# Patient Record
Sex: Female | Born: 1990 | Race: Black or African American | Hispanic: No | Marital: Married | Smoking: Never smoker
Health system: Southern US, Community
[De-identification: ages and names within clinical notes are randomized; demographics above are authoritative.]

## PROBLEM LIST (undated history)

## (undated) DIAGNOSIS — Z789 Other specified health status: Secondary | ICD-10-CM

---

## 2013-12-31 ENCOUNTER — Encounter (HOSPITAL_COMMUNITY): Payer: Self-pay | Admitting: Emergency Medicine

## 2013-12-31 ENCOUNTER — Emergency Department (HOSPITAL_COMMUNITY)
Admission: EM | Admit: 2013-12-31 | Discharge: 2014-01-01 | Disposition: A | Discharge status: Home / Self Care | Payer: Self-pay | Attending: Emergency Medicine | Admitting: Emergency Medicine

## 2013-12-31 DIAGNOSIS — J069 Acute upper respiratory infection, unspecified: Secondary | ICD-10-CM | POA: Insufficient documentation

## 2013-12-31 DIAGNOSIS — J040 Acute laryngitis: Secondary | ICD-10-CM | POA: Insufficient documentation

## 2013-12-31 DIAGNOSIS — Z79899 Other long term (current) drug therapy: Secondary | ICD-10-CM | POA: Insufficient documentation

## 2013-12-31 DIAGNOSIS — B9789 Other viral agents as the cause of diseases classified elsewhere: Secondary | ICD-10-CM

## 2013-12-31 NOTE — ED Notes (Signed)
Pt c/o sore throat with voice changes, pt states she did have n/v earlier today. Pt states feels similar to strep throat.

## 2014-01-01 LAB — RAPID STREP SCREEN (MED CTR MEBANE ONLY): Streptococcus, Group A Screen (Direct): NEGATIVE

## 2014-01-01 MED ORDER — GUAIFENESIN ER 1200 MG PO TB12: 1.0000 | ORAL_TABLET | Freq: Two times a day (BID) | ORAL | Status: DC

## 2014-01-01 MED ORDER — ACETAMINOPHEN-CODEINE 120-12 MG/5ML PO SOLN: 10.0000 mL | ORAL | Status: DC | PRN

## 2014-01-01 MED ORDER — PREDNISONE 50 MG PO TABS: 50.0000 mg | ORAL_TABLET | Freq: Every day | ORAL | Status: DC

## 2014-01-01 NOTE — Discharge Instructions (Signed)
Increase your fluid intake and rest as much possible.

## 2014-01-01 NOTE — ED Provider Notes (Signed)
CSN: [id number]     Arrival date & time 12/31/13  2210 History   First MD Initiated Contact with Patient 12/31/13 2336     Chief Complaint  Patient presents with  . Sore Throat     (Consider location/radiation/quality/duration/timing/severity/associated sxs/prior Treatment) HPI Patient presents to the emergency department with sore throat and cough over the last 4 days.  The patient states that she has lost her voice.  The patient states she did not take any medications prior to arrival.  The patient states that she did not have any chest pain, shortness of breath, nausea, vomiting, weakness, dizziness, rash, lightheadedness, dysuria, fever, runny nose, diarrhea, abdominal pain, headache, blurred vision, decreased appetite, rash, or syncope.  The patient states that nothing seems make her condition better or worse.  Patient states that she does not smoke or drink History reviewed. No pertinent past medical history. Past Surgical History  Procedure Laterality Date  . Cesarean section     No family history on file. History  Substance Use Topics  . Smoking status: Never Smoker   . Smokeless tobacco: Not on file  . Alcohol Use: No   OB History    No data available     Review of Systems  All other systems negative except as documented in the HPI. All pertinent positives and negatives as reviewed in the HPI.   Allergies  Sulfa antibiotics  Home Medications   Prior to Admission medications   Medication Sig Start Date End Date Taking? Authorizing Provider  guaiFENesin (MUCINEX) 600 MG 12 hr tablet Take 600 mg by mouth 3 (three) times daily.   Yes Historical Provider, MD   BP 111/77 mmHg  Pulse 91  Temp(Src) 98.4 F (36.9 C) (Oral)  Resp 18  Ht 5' (1.524 m)  Wt 175 lb (79.379 kg)  BMI 34.18 kg/m2  SpO2 100%  LMP 03/31/2013 Physical Exam  Constitutional: She is oriented to person, place, and time. She appears well-developed and well-nourished. No distress.  HENT:  Head:  Normocephalic and atraumatic.  Mouth/Throat: Oropharynx is clear and moist.  Eyes: Pupils are equal, round, and reactive to light.  Neck: Normal range of motion. Neck supple.  Cardiovascular: Normal rate, regular rhythm and normal heart sounds.  Exam reveals no gallop and no friction rub.   No murmur heard. Pulmonary/Chest: Effort normal and breath sounds normal. No respiratory distress.  Musculoskeletal: She exhibits no edema.  Neurological: She is alert and oriented to person, place, and time. She exhibits normal muscle tone. Coordination normal.  Skin: Skin is warm and dry.  Nursing note and vitals reviewed.   ED Course  Procedures (including critical care time) Labs Review Labs Reviewed  RAPID STREP SCREEN  CULTURE, GROUP A STREP   Patient is advised of her test results and told to return here as needed.  Advised her to increase her fluid intake and rest as much as possible.  MDM   Final diagnoses:  Laryngitis  Viral URI with cough        Carlyle DollyChristopher W Saba Neuman, PA-C 01/01/14 1651  Loren Raceravid Yelverton, MD 01/02/14 [id number]

## 2014-01-03 LAB — CULTURE, GROUP A STREP

## 2014-01-19 ENCOUNTER — Emergency Department (HOSPITAL_COMMUNITY)
Admission: EM | Admit: 2014-01-19 | Discharge: 2014-01-19 | Disposition: A | Discharge status: Home / Self Care | Payer: Self-pay | Attending: Emergency Medicine | Admitting: Emergency Medicine

## 2014-01-19 ENCOUNTER — Encounter (HOSPITAL_COMMUNITY): Payer: Self-pay | Admitting: *Deleted

## 2014-01-19 DIAGNOSIS — M542 Cervicalgia: Secondary | ICD-10-CM | POA: Insufficient documentation

## 2014-01-19 DIAGNOSIS — J029 Acute pharyngitis, unspecified: Secondary | ICD-10-CM | POA: Insufficient documentation

## 2014-01-19 DIAGNOSIS — Z79891 Long term (current) use of opiate analgesic: Secondary | ICD-10-CM | POA: Insufficient documentation

## 2014-01-19 MED ORDER — GUAIFENESIN ER 1200 MG PO TB12: 1.0000 | ORAL_TABLET | Freq: Two times a day (BID) | ORAL | Status: DC

## 2014-01-19 MED ORDER — PREDNISONE 50 MG PO TABS: 50.0000 mg | ORAL_TABLET | Freq: Every day | ORAL | Status: DC

## 2014-01-19 NOTE — ED Notes (Signed)
Pt states she has had sore throat and cough since yesterday. Pt states she was seen two weeks ago for same, was given prednisone, which helped.

## 2014-01-19 NOTE — Discharge Instructions (Signed)

## 2014-01-19 NOTE — ED Provider Notes (Signed)
CSN: [id number]     Arrival date & time 01/19/14  [id number] History  This chart was scribed for non-physician practitioner, Fayrene HelperBowie Chawn Spraggins, PA-C,working with Suzi RootsKevin E Steinl, MD, by Karle PlumberJennifer Tensley, ED Scribe. This patient was seen in room WTR8/WTR8 and the patient's care was started at 7:13 PM.  Chief Complaint  Patient presents with  . Sore Throat  . Cough   HPI  HPI Comments:  Alexis HammersmithBrittany Byrd is a 23 y.o. female who presents to the Emergency Department complaining of severe sore throat that began yesterday. Pt reports she has associated cough, neck pain and some loss of voice. She reports she has similar symptoms two weeks ago but they resolved with Prednisone and Guaifenesin.  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 Reports she has been doing salt water gargles. Denies any modifying factors. Denies fever, chills, otalgia, nausea, vomiting, SOB, abdominal pain or diarrhea.   History reviewed. No pertinent past medical history. Past Surgical History  Procedure Laterality Date  . Cesarean section     No family history on file. History  Substance Use Topics  . Smoking status: Never Smoker   . Smokeless tobacco: Not on file  . Alcohol Use: No   OB History    No data available     Review of Systems  Constitutional: Negative for fever and chills.  HENT: Positive for sore throat. Negative for ear pain.   Respiratory: Positive for cough.   Gastrointestinal: Negative for nausea, vomiting, abdominal pain and diarrhea.  All other systems reviewed and are negative.   Allergies  Sulfa antibiotics  Home Medications   Prior to Admission medications   Medication Sig Start Date End Date Taking? Authorizing Provider  acetaminophen-codeine 120-12 MG/5ML solution Take 10 mLs by mouth every 4 (four) hours as needed for moderate pain. 01/01/14   Jamesetta Orleanshristopher W Lawyer, PA-C  Guaifenesin 1200 MG TB12 Take 1 tablet (1,200 mg total) by mouth 2 (two) times daily. 01/01/14    Jamesetta Orleanshristopher W Lawyer, PA-C  predniSONE (DELTASONE) 50 MG tablet Take 1 tablet (50 mg total) by mouth daily. 01/01/14   Carlyle Dollyhristopher W Lawyer, PA-C   Triage Vitals: BP 144/84 mmHg  Pulse 74  Temp(Src) 99 F (37.2 C) (Oral)  Resp 18  SpO2 98%  LMP 03/31/2013 Physical Exam  Constitutional: She is oriented to person, place, and time. She appears well-developed and well-nourished.  HENT:  Head: Normocephalic and atraumatic.  Right Ear: Tympanic membrane normal.  Left Ear: Tympanic membrane normal.  Nose: Nose normal.  Mouth/Throat: Uvula is midline and mucous membranes are normal. No trismus in the jaw. No oropharyngeal exudate or tonsillar abscesses.  Mild tonsillar enlargement bilaterally.  Eyes: EOM are normal.  Neck: Normal range of motion.  Cardiovascular: Normal rate, regular rhythm and normal heart sounds.  Exam reveals no gallop and no friction rub.   No murmur heard. Pulmonary/Chest: Effort normal. No respiratory distress. She has no wheezes. She has no rales.  Musculoskeletal: Normal range of motion.  Neurological: She is alert and oriented to person, place, and time.  Skin: Skin is warm and dry.  Psychiatric: She has a normal mood and affect. Her behavior is normal.  Nursing note and vitals reviewed.   ED Course  Procedures (including critical care time) DIAGNOSTIC STUDIES: Oxygen Saturation is 98% on RA, normal by my interpretation.   COORDINATION OF CARE: 7:17 PM- Will prescribe Prednisone and Guaifenesin and give referral to ENT. Pt verbalizes understanding and agrees to plan.  Medications - No data to display  Labs Review Labs Reviewed - No data to display  Imaging Review No results found.   EKG Interpretation None      MDM   Final diagnoses:  Pharyngitis    BP 144/84 mmHg  Pulse 74  Temp(Src) 99 F (37.2 C) (Oral)  Resp 18  SpO2 98%  LMP 03/31/2013   I personally performed the services described in this documentation, which was scribed in  my presence. The recorded information has been reviewed and is accurate.    Fayrene HelperBowie Culver Feighner, PA-C 01/19/14 2002  Suzi RootsKevin E Steinl, MD 01/19/14 2003

## 2014-02-18 ENCOUNTER — Encounter (HOSPITAL_COMMUNITY): Payer: Self-pay | Admitting: Emergency Medicine

## 2014-02-18 ENCOUNTER — Emergency Department (HOSPITAL_COMMUNITY)
Admission: EM | Admit: 2014-02-18 | Discharge: 2014-02-18 | Disposition: A | Discharge status: Home / Self Care | Payer: Medicaid Other | Attending: Emergency Medicine | Admitting: Emergency Medicine

## 2014-02-18 DIAGNOSIS — B9689 Other specified bacterial agents as the cause of diseases classified elsewhere: Secondary | ICD-10-CM

## 2014-02-18 DIAGNOSIS — M545 Low back pain, unspecified: Secondary | ICD-10-CM

## 2014-02-18 DIAGNOSIS — N760 Acute vaginitis: Secondary | ICD-10-CM

## 2014-02-18 DIAGNOSIS — R1030 Lower abdominal pain, unspecified: Secondary | ICD-10-CM | POA: Diagnosis present

## 2014-02-18 DIAGNOSIS — Z7952 Long term (current) use of systemic steroids: Secondary | ICD-10-CM | POA: Diagnosis not present

## 2014-02-18 DIAGNOSIS — Z79899 Other long term (current) drug therapy: Secondary | ICD-10-CM | POA: Insufficient documentation

## 2014-02-18 DIAGNOSIS — R102 Pelvic and perineal pain: Secondary | ICD-10-CM

## 2014-02-18 DIAGNOSIS — Z9889 Other specified postprocedural states: Secondary | ICD-10-CM | POA: Insufficient documentation

## 2014-02-18 LAB — URINALYSIS, ROUTINE W REFLEX MICROSCOPIC
BILIRUBIN URINE: NEGATIVE
Glucose, UA: NEGATIVE mg/dL
Hgb urine dipstick: NEGATIVE
Ketones, ur: NEGATIVE mg/dL
Leukocytes, UA: NEGATIVE
NITRITE: NEGATIVE
PROTEIN: NEGATIVE mg/dL
Specific Gravity, Urine: 1.004 — ABNORMAL LOW (ref 1.005–1.030)
UROBILINOGEN UA: 0.2 mg/dL (ref 0.0–1.0)
pH: 6.5 (ref 5.0–8.0)

## 2014-02-18 LAB — POC URINE PREG, ED: PREG TEST UR: NEGATIVE

## 2014-02-18 LAB — CBC WITH DIFFERENTIAL/PLATELET
BASOS ABS: 0 10*3/uL (ref 0.0–0.1)
BASOS PCT: 0 % (ref 0–1)
Eosinophils Absolute: 0.1 10*3/uL (ref 0.0–0.7)
Eosinophils Relative: 2 % (ref 0–5)
HCT: 40.7 % (ref 36.0–46.0)
HEMOGLOBIN: 12.7 g/dL (ref 12.0–15.0)
LYMPHS PCT: 39 % (ref 12–46)
Lymphs Abs: 1.9 10*3/uL (ref 0.7–4.0)
MCH: 26.8 pg (ref 26.0–34.0)
MCHC: 31.2 g/dL (ref 30.0–36.0)
MCV: 85.9 fL (ref 78.0–100.0)
MONO ABS: 0.7 10*3/uL (ref 0.1–1.0)
MONOS PCT: 15 % — AB (ref 3–12)
Neutro Abs: 2.2 10*3/uL (ref 1.7–7.7)
Neutrophils Relative %: 44 % (ref 43–77)
PLATELETS: 214 10*3/uL (ref 150–400)
RBC: 4.74 MIL/uL (ref 3.87–5.11)
RDW: 14.2 % (ref 11.5–15.5)
WBC: 5 10*3/uL (ref 4.0–10.5)

## 2014-02-18 LAB — COMPREHENSIVE METABOLIC PANEL
ALT: 32 U/L (ref 0–35)
AST: 25 U/L (ref 0–37)
Albumin: 4 g/dL (ref 3.5–5.2)
Alkaline Phosphatase: 64 U/L (ref 39–117)
Anion gap: 9 (ref 5–15)
BUN: 14 mg/dL (ref 6–23)
CO2: 24 mmol/L (ref 19–32)
Calcium: 9.2 mg/dL (ref 8.4–10.5)
Chloride: 104 mmol/L (ref 96–112)
Creatinine, Ser: 0.66 mg/dL (ref 0.50–1.10)
GFR calc Af Amer: >90 mL/min (ref >90)
Glucose, Bld: 83 mg/dL (ref 70–99)
Potassium: 4.3 mmol/L (ref 3.5–5.1)
Sodium: 137 mmol/L (ref 135–145)
TOTAL PROTEIN: 8 g/dL (ref 6.0–8.3)
Total Bilirubin: 0.6 mg/dL (ref 0.3–1.2)

## 2014-02-18 LAB — WET PREP, GENITAL
Trich, Wet Prep: NONE SEEN
YEAST WET PREP: NONE SEEN

## 2014-02-18 MED ORDER — METRONIDAZOLE 500 MG PO TABS: 500.0000 mg | ORAL_TABLET | Freq: Two times a day (BID) | ORAL | Status: DC

## 2014-02-18 MED ORDER — KETOROLAC TROMETHAMINE 60 MG/2ML IM SOLN
60.0000 mg | Freq: Once | INTRAMUSCULAR | Status: AC
  Administered 2014-02-18: 60 mg via INTRAMUSCULAR
  Filled 2014-02-18: qty 2

## 2014-02-18 NOTE — ED Provider Notes (Signed)
CSN: [id number]     Arrival date & time 02/18/14  1107 History   First MD Initiated Contact with Patient 02/18/14 1135     Chief Complaint  Patient presents with  . Groin Pain  . Back Pain  . Dysuria     (Consider location/radiation/quality/duration/timing/severity/associated sxs/prior Treatment) HPI  Alexis Byrd is a 24 y.o. female with PMH of cesarean section presenting with complaint of bilateral low back pain as well as right groin pain that she has had intermittently for one month. Patient reports it feels like "my ovary is swollen" as well as sharp. Patient does not note aggravating or alleviating factors. She is not taken anything for her discomfort. She does endorse colicky as well as strong smell to her urine. She denies pain or burning with urination. She has used Azo for urinary symptoms without significant relief. She denies any fevers, chills, nausea, vomiting, stool changes, chest pain, shortness of breath. Patient has had history of abdominal surgeries as well as chlamydia. She uses Nexplanon but does not use condoms. She denies new sexual partners. She does endorse copious white discharge but denies foul odor. No loss of control of bladder or bowel. No saddle anesthesia or numbness and tingling or weakness in her lower extremities.   History reviewed. No pertinent past medical history. Past Surgical History  Procedure Laterality Date  . Cesarean section     History reviewed. No pertinent family history. History  Substance Use Topics  . Smoking status: Never Smoker   . Smokeless tobacco: Not on file  . Alcohol Use: No   OB History    No data available     Review of Systems 10 Systems reviewed and are negative for acute change except as noted in the HPI.    Allergies  Sulfa antibiotics  Home Medications   Prior to Admission medications   Medication Sig Start Date End Date Taking? Authorizing Provider  etonogestrel (NEXPLANON) 68 MG IMPL implant 1 each by  Subdermal route once. March 2015   Yes Historical Provider, MD  acetaminophen-codeine 120-12 MG/5ML solution Take 10 mLs by mouth every 4 (four) hours as needed for moderate pain. Patient not taking: Reported on 02/18/2014 01/01/14   Jamesetta Orleanshristopher W Lawyer, PA-C  Guaifenesin 1200 MG TB12 Take 1 tablet (1,200 mg total) by mouth 2 (two) times daily. Patient not taking: Reported on 02/18/2014 01/19/14   Fayrene HelperBowie Tran, PA-C  metroNIDAZOLE (FLAGYL) 500 MG tablet Take 1 tablet (500 mg total) by mouth 2 (two) times daily. 02/18/14   Louann SjogrenVictoria L Kiley Torrence, PA-C  predniSONE (DELTASONE) 50 MG tablet Take 1 tablet (50 mg total) by mouth daily. Patient not taking: Reported on 02/18/2014 01/19/14   Fayrene HelperBowie Tran, PA-C   BP 94/49 mmHg  Pulse 78  Temp(Src) 99.2 F (37.3 C) (Oral)  Resp 20  SpO2 98% Physical Exam  Constitutional: She appears well-developed and well-nourished. No distress.  HENT:  Head: Normocephalic and atraumatic.  Mouth/Throat: Oropharynx is clear and moist.  Eyes: Conjunctivae and EOM are normal. Right eye exhibits no discharge. Left eye exhibits no discharge.  Cardiovascular: Normal rate, regular rhythm and normal heart sounds.   Pulmonary/Chest: Effort normal and breath sounds normal. No respiratory distress. She has no wheezes.  Abdominal: Soft. Bowel sounds are normal. She exhibits no distension. There is no tenderness.  Right lower pelvic pain without rebound, rigidity, guarding. No CVA tenderness.  Genitourinary:  Cervix pink without lesions. Os closed. No CMT no left with right adnexal tenderness. Moderate white milky  discharge without foul odor. Nursing tech in room for exam.  Musculoskeletal:  No midline back tenderness, step off or crepitus. Bilateral lower back tenderness. No CVA tenderness.   Neurological: She is alert. She exhibits normal muscle tone. Coordination normal.  Equal muscle tone. 5/5 strength in lower extremities. DTR equal and intact. Negative straight leg test. Normal  gait.   Skin: Skin is warm and dry. She is not diaphoretic.  Nursing note and vitals reviewed.   ED Course  Procedures (including critical care time) Labs Review Labs Reviewed  WET PREP, GENITAL - Abnormal; Notable for the following:    Clue Cells Wet Prep HPF POC FEW (*)    WBC, Wet Prep HPF POC FEW (*)    All other components within normal limits  CBC WITH DIFFERENTIAL/PLATELET - Abnormal; Notable for the following:    Monocytes Relative 15 (*)    All other components within normal limits  URINALYSIS, ROUTINE W REFLEX MICROSCOPIC - Abnormal; Notable for the following:    APPearance CLOUDY (*)    Specific Gravity, Urine 1.004 (*)    All other components within normal limits  URINE CULTURE  COMPREHENSIVE METABOLIC PANEL  RPR  HIV ANTIBODY (ROUTINE TESTING)  POC URINE PREG, ED  GC/CHLAMYDIA PROBE AMP ([place])    Imaging Review No results found.   EKG Interpretation None      MDM   Final diagnoses:  Pelvic pain in female  Bilateral low back pain without sciatica  BV (bacterial vaginosis)   Patient with history of right lower pelvic discomfort over the past one month that is intermittent. Patient denies any other abdominal symptoms. No fevers or chills. She states this feels like a UTI however her urine is clear. She has noted an increase in white foul smelling vaginal discharge. VSS. Patient with mild right lower abdominal tenderness. Pelvic exam with much more discomfort than abdominal exam. I doubt appendicitis or torsion. Wet prep with few white blood cells as well as clue cells. No CMT. I doubt PID. Patient diagnosed with BV.  Discussed that patients STD testing is pending and she is to inform all partners if they become positive and presented for treatment. On repeat abdominal exam patient's pain with significant improvement. No evidence of peritonitis. Lab work noncontributory. Patient to follow-up with [facility] for further workup.  Discussed return  precautions with patient. Discussed all results and patient verbalizes understanding and agrees with plan.  This is a shared patient. This patient was discussed with the physician who saw and evaluated the patient and agrees with the plan.     Louann Sjogren, PA-C 02/18/14 1628  Donnetta Hutching, MD 02/19/14 [phone]

## 2014-02-18 NOTE — ED Notes (Signed)
Pt c/o lower back pain and right groin pain. Reports she feels like "my ovary is swollen." Also c/o dysuria, malodorous urine and "darker urine than usual." Denies fever/chills/nausea/vomiting. RR even/unlabored. Attempted using Azo for urinary symptoms.

## 2014-02-18 NOTE — Discharge Instructions (Signed)
Return to the emergency room with worsening of symptoms, new symptoms or with symptoms that are concerning , especially fevers, abdominal pain in one area, unable to keep down fluids, blood in stool or vomit, severe pain, you feel faint, lightheaded or pass out or fevers, loss of control of bladder or bowels, numbness or tingling around genital region or anus, weakness. RICE: Rest, Ice (three cycles of 20 mins on, off at least twice a day), compression/brace, elevation. Heating pad works well for back pain. Ibuprofen  (2 tablets ) every 5-6 hours for 3-5 days  Follow up with PCP/orthopedist if symptoms worsen or are persistent. Call to make appointment with [facility] for follow-up and possible ultrasound.   Abdominal Pain, Women Abdominal (stomach, pelvic, or belly) pain can be caused by many things. It is important to tell your doctor:  The location of the pain.  Does it come and go or is it present all the time?  Are there things that start the pain (eating certain foods, exercise)?  Are there other symptoms associated with the pain (fever, nausea, vomiting, diarrhea)? All of this is helpful to know when trying to find the cause of the pain. CAUSES   Stomach: virus or bacteria infection, or ulcer.  Intestine: appendicitis (inflamed appendix), regional ileitis (Crohn's disease), ulcerative colitis (inflamed colon), irritable bowel syndrome, diverticulitis (inflamed diverticulum of the colon), or cancer of the stomach or intestine.  Gallbladder disease or stones in the gallbladder.  Kidney disease, kidney stones, or infection.  Pancreas infection or cancer.  Fibromyalgia (pain disorder).  Diseases of the female organs:  Uterus: fibroid (non-cancerous) tumors or infection.  Fallopian tubes: infection or tubal pregnancy.  Ovary: cysts or tumors.  Pelvic adhesions (scar tissue).  Endometriosis (uterus lining tissue growing in the pelvis and on the pelvic  organs).  Pelvic congestion syndrome (female organs filling up with blood just before the menstrual period).  Pain with the menstrual period.  Pain with ovulation (producing an egg).  Pain with an IUD (intrauterine device, birth control) in the uterus.  Cancer of the female organs.  Functional pain (pain not caused by a disease, may improve without treatment).  Psychological pain.  Depression. DIAGNOSIS  Your doctor will decide the seriousness of your pain by doing an examination.  Blood tests.  X-rays.  Ultrasound.  CT scan (computed tomography, special type of X-ray).  MRI (magnetic resonance imaging).  Cultures, for infection.  Barium enema (dye inserted in the large intestine, to better view it with X-rays).  Colonoscopy (looking in intestine with a lighted tube).  Laparoscopy (minor surgery, looking in abdomen with a lighted tube).  Major abdominal exploratory surgery (looking in abdomen with a large incision). TREATMENT  The treatment will depend on the cause of the pain.   Many cases can be observed and treated at home.  Over-the-counter medicines recommended by your caregiver.  Prescription medicine.  Antibiotics, for infection.  Birth control pills, for painful periods or for ovulation pain.  Hormone treatment, for endometriosis.  Nerve blocking injections.  Physical therapy.  Antidepressants.  Counseling with a psychologist or psychiatrist.  Minor or major surgery. HOME CARE INSTRUCTIONS   Do not take laxatives, unless directed by your caregiver.  Take over-the-counter pain medicine only if ordered by your caregiver. Do not take aspirin because it can cause an upset stomach or bleeding.  Try a clear liquid diet (broth or water) as ordered by your caregiver. Slowly move to a bland diet, as tolerated, if the  pain is related to the stomach or intestine.  Have a thermometer and take your temperature several times a day, and record  it.  Bed rest and sleep, if it helps the pain.  Avoid sexual intercourse, if it causes pain.  Avoid stressful situations.  Keep your follow-up appointments and tests, as your caregiver orders.  If the pain does not go away with medicine or surgery, you may try:  Acupuncture.  Relaxation exercises (yoga, meditation).  Group therapy.  Counseling. SEEK MEDICAL CARE IF:   You notice certain foods cause stomach pain.  Your home care treatment is not helping your pain.  You need stronger pain medicine.  You want your IUD removed.  You feel faint or lightheaded.  You develop nausea and vomiting.  You develop a rash.  You are having side effects or an allergy to your medicine. SEEK IMMEDIATE MEDICAL CARE IF:   Your pain does not go away or gets worse.  You have a fever.  Your pain is felt only in portions of the abdomen. The right side could possibly be appendicitis. The left lower portion of the abdomen could be colitis or diverticulitis.  You are passing blood in your stools (bright red or black tarry stools, with or without vomiting).  You have blood in your urine.  You develop chills, with or without a fever.  You pass out. MAKE SURE YOU:   Understand these instructions.  Will watch your condition.  Will get help right away if you are not doing well or get worse. Document Released: 11/03/2006 Document Revised: 05/23/2013 Document Reviewed: 11/23/2008 [facility] Patient Information 2015 [place], [place]. This information is not intended to replace advice given to you by your health care provider. Make sure you discuss any questions you have with your health care provider.

## 2014-02-19 LAB — URINE CULTURE: Colony Count: 30000

## 2014-02-20 LAB — GC/CHLAMYDIA PROBE AMP (~~LOC~~) NOT AT ARMC
Chlamydia: NEGATIVE
Neisseria Gonorrhea: NEGATIVE

## 2014-02-21 LAB — RPR: RPR Ser Ql: NONREACTIVE

## 2014-02-21 LAB — HIV ANTIBODY (ROUTINE TESTING W REFLEX): HIV SCREEN 4TH GENERATION: NONREACTIVE

## 2014-05-29 ENCOUNTER — Emergency Department (HOSPITAL_COMMUNITY)
Admission: EM | Admit: 2014-05-29 | Discharge: 2014-05-29 | Disposition: A | Discharge status: Home / Self Care | Payer: Self-pay | Attending: Emergency Medicine | Admitting: Emergency Medicine

## 2014-05-29 ENCOUNTER — Encounter (HOSPITAL_COMMUNITY): Payer: Self-pay

## 2014-05-29 DIAGNOSIS — J069 Acute upper respiratory infection, unspecified: Secondary | ICD-10-CM | POA: Insufficient documentation

## 2014-05-29 LAB — RAPID STREP SCREEN (MED CTR MEBANE ONLY): Streptococcus, Group A Screen (Direct): NEGATIVE

## 2014-05-29 NOTE — ED Provider Notes (Signed)
CSN: [id number]     Arrival date & time 05/29/14  0844 History   First MD Initiated Contact with Patient 05/29/14 [phone]     Chief Complaint  Patient presents with  . Sore Throat      HPI  Pt was seen at 0900.  Per pt, c/o gradual onset and persistence of constant sore throat, runny/stuffy nose, ears and sinus congestion for the past 4 days. Pt states today she "started coughing." Endorses her child currently "has a cold."  Denies fevers, no rash, no CP/SOB, no N/V/D, no abd pain.    History reviewed. No pertinent past medical history.   Past Surgical History  Procedure Laterality Date  . Cesarean section      History  Substance Use Topics  . Smoking status: Never Smoker   . Smokeless tobacco: Not on file  . Alcohol Use: No    Review of Systems ROS: Statement: All systems negative except as marked or noted in the HPI; Constitutional: Negative for fever and chills. ; ; Eyes: Negative for eye pain, redness and discharge. ; ; ENMT: Negative for ear pain, hoarseness, +nasal congestion, ears congestion, sinus pressure and sore throat. ; ; Cardiovascular: Negative for chest pain, palpitations, diaphoresis, dyspnea and peripheral edema. ; ; Respiratory: +cough. Negative for wheezing and stridor. ; ; Gastrointestinal: Negative for nausea, vomiting, diarrhea, abdominal pain, blood in stool, hematemesis, jaundice and rectal bleeding. . ; ; Genitourinary: Negative for dysuria, flank pain and hematuria. ; ; Musculoskeletal: Negative for back pain and neck pain. Negative for swelling and trauma.; ; Skin: Negative for pruritus, rash, abrasions, blisters, bruising and skin lesion.; ; Neuro: Negative for headache, lightheadedness and neck stiffness. Negative for weakness, altered level of consciousness , altered mental status, extremity weakness, paresthesias, involuntary movement, seizure and syncope.      Allergies  Sulfa antibiotics  Home Medications   Prior to Admission medications    Medication Sig Start Date End Date Taking? Authorizing Provider  acetaminophen-codeine 120-12 MG/5ML solution Take 10 mLs by mouth every 4 (four) hours as needed for moderate pain. Patient not taking: Reported on 02/18/2014 01/01/14   Charlestine Nighthristopher Lawyer, PA-C  etonogestrel (NEXPLANON) 68 MG IMPL implant 1 each by Subdermal route once. March 2015    Historical Provider, MD  Guaifenesin 1200 MG TB12 Take 1 tablet (1,200 mg total) by mouth 2 (two) times daily. Patient not taking: Reported on 02/18/2014 01/19/14   Fayrene HelperBowie Tran, PA-C  metroNIDAZOLE (FLAGYL) 500 MG tablet Take 1 tablet (500 mg total) by mouth 2 (two) times daily. 02/18/14   Oswaldo ConroyVictoria Creech, PA-C  predniSONE (DELTASONE) 50 MG tablet Take 1 tablet (50 mg total) by mouth daily. Patient not taking: Reported on 02/18/2014 01/19/14   Fayrene HelperBowie Tran, PA-C   BP 119/76 mmHg  Pulse 81  Temp(Src) 98.2 F (36.8 C) (Oral)  Resp 18  SpO2 100% Physical Exam  0905: Physical examination:  Nursing notes reviewed; Vital signs and O2 SAT reviewed;  Constitutional: Well developed, Well nourished, Well hydrated, In no acute distress; Head:  Normocephalic, atraumatic; Eyes: EOMI, PERRL, No scleral icterus; ENMT: TM's clear bilat. +edemetous nasal turbinates bilat with clear rhinorrhea. +visualized post nasal gtt in posterior pharynx. Mouth and pharynx without lesions. No tonsillar exudates. No intra-oral edema. No submandibular or sublingual edema. No hoarse voice, no drooling, no stridor. No pain with manipulation of larynx. No trismus. Mouth and pharynx normal, Mucous membranes moist; Neck: Supple, Full range of motion, No lymphadenopathy; Cardiovascular: Regular rate and rhythm, No murmur,  rub, or gallop; Respiratory: Breath sounds clear & equal bilaterally, No rales, rhonchi, wheezes.  Speaking full sentences with ease, Normal respiratory effort/excursion; Chest: Nontender, Movement normal; Abdomen: Soft, Nontender, Nondistended, Normal bowel sounds;  Genitourinary: No CVA tenderness; Extremities: Pulses normal, No tenderness, No edema, No calf edema or asymmetry.; Neuro: AA&Ox3, Major CN grossly intact.  Speech clear. No gross focal motor or sensory deficits in extremities.; Skin: Color normal, Warm, Dry.   ED Course  Procedures     EKG Interpretation None      MDM  MDM Reviewed: previous chart, nursing note and vitals Interpretation: labs     Results for orders placed or performed during the hospital encounter of 05/29/14  Rapid strep screen  Result Value Ref Range   Streptococcus, Group A Screen (Direct) NEGATIVE NEGATIVE    0945:  Strep test negative. Tx symptomatically at this time. Dx and testing d/w pt.  Questions answered.  Verb understanding, agreeable to d/c home with outpt f/u.     Samuel JesterKathleen Chelsa Stout, DO 05/31/14 1311

## 2014-05-29 NOTE — ED Notes (Signed)
Pt with sore throat since Friday.  No fever.  No n/v

## 2014-05-29 NOTE — Discharge Instructions (Signed)
°Emergency Department Resource Guide °1) Find a Doctor and Pay Out of Pocket °Although you won't have to find out who is covered by your insurance plan, it is a good idea to ask around and get recommendations. You will then need to call the office and see if the doctor you have chosen will accept you as a new patient and what types of options they offer for patients who are self-pay. Some doctors offer discounts or will set up payment plans for their patients who do not have insurance, but you will need to ask so you aren't surprised when you get to your appointment. ° °2) Contact Your Local Health Department °Not all health departments have doctors that can see patients for sick visits, but many do, so it is worth a call to see if yours does. If you don't know where your local health department is, you can check in your phone book. The CDC also has a tool to help you locate your state's health department, and many state websites also have listings of all of their local health departments. ° °3) Find a Walk-in Clinic °If your illness is not likely to be very severe or complicated, you may want to try a walk in clinic. These are popping up all over the country in pharmacies, drugstores, and shopping centers. They're usually staffed by nurse practitioners or physician assistants that have been trained to treat common illnesses and complaints. They're usually fairly quick and inexpensive. However, if you have serious medical issues or chronic medical problems, these are probably not your best option. ° °No Primary Care Doctor: °- Call [facility] at  832-8000 - they can help you locate a primary care doctor that  accepts your insurance, provides certain services, etc. °- Physician Referral Service- 1-800-533-3463 ° °Chronic Pain Problems: °Organization         Address  Phone   Notes  °[facility]  (336) 297-2271 Patients need to be referred by their primary care doctor.  ° °Medication  Assistance: °Organization         Address  Phone   Notes  °[place] [facility] [address] °[place], [place] [address] (336) 641-8030 --Must be a resident of [place] °-- Must have NO insurance coverage whatsoever (no Medicaid/ Medicare, etc.) °-- The pt. MUST have a primary care doctor that directs their care regularly and follows them in the community °  °[facility]  (866) 331-1348   °[employer]  (888) 892-1162   ° °Agencies that provide inexpensive medical care: °Organization         Address  Phone   Notes  °[facility]  (336) 832-8035   °[facility]    (336) 832-7272   °[facility] [address] °[place], [place] [address] (336) 832-4777   °[facility] [address], °[place] (336) 271-4999   °[facility]    (336) 373-0678   °[facility]    (336) 272-1050   °[facility] ° [address], [place] Phone:  (336) 832-4444, Fax:  (336) 832-4440 Hours of Operation:  9 am - 6 pm, M-F.  Also accepts Medicaid/Medicare and self-pay.  °[facility] ° [address], [place] Phone: (336) 832-3150, Fax: (336) [phone]. Hours of Operation:  8:30 am - 5:30 pm, M-F.  Also accepts Medicaid and self-pay.  °[facility] [place] [address], [place] Phone: (336) 878-6027   °[facility] [address], [place], [place] (336)723-1848, Ext. 123 Mondays & Thursdays: 7-9 AM.  First 15 patients are seen on a first come, first serve basis. °  ° °Medicaid-accepting [place] Providers: ° °Organization         Address  Phone   Notes  °[facility] [address], [place] (336) 641-2100 Also accepts self-pay patients.  °[facility] [address], [place] ° (336) 856-9996   °[facility] [address], [place]  (336) 288-8857   °[facility] [address] [place] [address], [place] (336) 299-7000   °Veita Bland [address], [place]  ° (336) 373-1557 Only accepts [place] Access Medicaid patients after they have their name applied to their card.  ° °Self-Pay (no insurance) in [place]: ° °Organization         Address  Phone   Notes  °Sickle Cell Patients, [facility] [address], [place] (336) 832-1970   °[facility] [address], [place] (336) 832-4400   °[facility] ° [address], [place] (336) 992-4800   °[facility]/Dr. Osei-Bonsu ° [address] [place] [address], [place] or [address], [place] (336) 841-8500 Phone number for both [place] and [place] locations is the same.  °[facility] [address], [place] (336) 299-0000   °[facility] [address] [place] [address], [place] or [address] (336) 852-7530 °(336) 878-2260   °[facility] [address], [place] (336) 350-1642, phone; (336) 294-5005, fax Sees patients 1st and 3rd Saturday of every month.  Must not qualify for public or private insurance (i.e. Medicaid, Medicare, [place] Health Choice, Veterans' Benefits) • Household income should be no more than 200% of the poverty level •The clinic cannot treat you if you are pregnant or think you are pregnant • Sexually transmitted diseases are not treated at the clinic.  ° ° °Dental Care: °Organization         Address  Phone  Notes  °[place] [facility] [address], [place] (336) 641-6152 Accepts children up to age 21 who are enrolled in Medicaid or [place] Health Choice; pregnant women with a Medicaid card; and children who have applied for Medicaid or [place] Health Choice, but were declined, whose parents can pay a reduced fee at time of service.  °[place]  [facility] [place]  [address], [place] (336) 641-7733 Accepts children up to age 21 who are enrolled in Medicaid or [place] Health Choice; pregnant women with a Medicaid card; and children who have applied for Medicaid or [place] Health Choice, but were declined, whose parents can pay a reduced fee at time of service.  °[facility] ° [address], [place] (336) 641-4533 Patients are seen by appointment only. Walk-ins are not accepted. [facility] will see patients 18 years of age and older. °Monday - Tuesday (8am-5pm) °Most Wednesdays (8:30-5pm) °$30 per visit, cash only  °[facility] ° [address], [place] (336) 641-4533 Patients are seen by appointment only. Walk-ins are not accepted. [facility] will see patients 18 years of age and older. °One   Wednesday Evening (Monthly: Volunteer Based).  $30 per visit, cash only  °[facility]  (919) 537-3737 for adults; Children under age 4, call Graduate Pediatric Dentistry at ([phone]) [phone]. Children aged 4-14, please call (919) [phone] to request a pediatric application. ° Dental services are provided in all areas of dental care including fillings, crowns and bridges, complete and partial dentures, implants, gum treatment, root canals, and extractions. Preventive care is also provided. Treatment is provided to both adults and children. °Patients are selected via a lottery and there is often a waiting list. °  °[facility] [address], °[place] ° (336) 763-8833 [email or web address] °  °[facility] [address], [place], [place] (336)723-1848, Ext. 123 Second and Fourth Thursday of each month, opens at 6:30 AM; Clinic ends at 9 AM.  Patients are seen on a first-come first-served basis, and a limited number are seen during each clinic.  ° °[facility] ° [address], [place], [place] (336) 723-7904    Eligibility Requirements °You must have lived in [place], [place], or [place] counties for at least the last three months. °  You cannot be eligible for state or federal sponsored healthcare insurance, including Veterans Administration, Medicaid, or Medicare. °  You generally cannot be eligible for healthcare insurance through your employer.  °  How to apply: °Eligibility screenings are held every Tuesday and Wednesday afternoon from 1:00 pm until 4:00 pm. You do not need an appointment for the interview!  °[facility] [address], [place], [place] 336-631-2330   °[facility]  336-342-8273   °[facility]  336-703-3100   °[facility]  336-570-6415   ° °Behavioral Health Resources in the Community: °Intensive Outpatient Programs °Organization         Address  Phone  Notes  °[facility] [address], [place], [place] 336-878-6098   °[facility] [address], [place], [place] 336-832-9800   °[facility] [address], [place], [place] ° 336-882-2125   °[facility] [address],  °[place], [place] 1-800-853-5163 or 336-641-4981   °Substance Abuse Resources °Organization         Address  Phone  Notes  °[facility]  336-882-2125   °[facility]  336-784-9470   °[facility]  336-285-9073   °[facility]  336-845-3988   °Residential & Outpatient Substance Abuse Program  1-800-659-3381   °Psychological Services °Organization         Address  Phone  Notes  °[facility]  336- 832-9600   °[facility]  336- 378-7881   °[facility] [address], [place] 1-800-853-5163 or 336-641-4981   ° °Mobile Crisis Teams °Organization         Address  Phone  Notes  °[facility], Mobile Crisis Care Unit  1-877-626-1772   °Assertive °[facility] ° [address]  [place], [place] 336-834-9664   °Sharon DeEsch [address] °[place] 336-554-5454   ° °Self-Help/Support Groups °Organization         Address  Phone             Notes  °[facility] of  - variety of support groups  336- 373-1402 Call for more information  °Narcotics Anonymous (NA), [facility] [address], °[place]  2 meetings at this location  ° °  Residential Treatment Programs Organization         Address  Phone  Notes  [facility] [address],    [place]  [phone]   [facility]  [address], [place] [phone], [place], [place] [phone]   [facility] [address], [place] [phone] Admissions: 8am-3pm M-F  [facility] [address]    [place], [place] [phone]   [facility] [address], [place], [place] 259-563-8756(762) [phone]   [facility] [address]  [place], [place] [phone]   [facility] Intensive Outpatient [address], [place], [place] [phone](425) [phone]   [facility] [address]  [place], [place] [phone] or (727)[phone]   [facility] [address] [place], [place] 376-283-1517([phone]) [phone] Accepts Medicaid  [facility] [address]  [place] [phone] Substance Abuse/Addiction Treatment   [facility] Organization         Address  Phone  Notes  [facility]  458-843-7993(888) 269-022-2043   Angie FavaJulie Brannon, PhD [address], [address] [place], [place]   ([phone](336) [phone] or [phone](336) ([phone]   [facility]   [address] [place], [place] [phone](336) ([phone]   [facility] [address], [place], [place] (504)[phone](336) [phone] Insurance/Medicaid/sponsorship through [facility] [address]                                    [place], [place] ([phone]) [phone] Therapy/tele-psych/case    [facility] [address].   [place], [place] ([phone](336) [phone]    Dr. Lolly MustacheArfeen  ([phone](336) ([phone]) [phone]   [facility]  [facility] 1) [address], [place] 2) [address], [place] 3)  [address], [place] ([phone]) [phone](336) ([phone](336) ([phone]  [phone](336) ([phone]) [phone]   [facility] (587)125-9295(336) (985)712-2609 or (303)607-7844(336) (660) 583-6893 (After Hours)      Take over the counter decongestant (such as sudafed), as well as an anti-histamine (such as claritin, zyrtec or allegra), as directed on packaging, for the next week.  Use over the counter normal saline nasal spray, as instructed in the Emergency Department, several times per day for the next 2 weeks.  Take over the counter tylenol and ibuprofen, as directed on packaging, as needed for discomfort.  Gargle with warm water several times per day to help with discomfort.  May also use over the counter sore throat pain medicines such as chloraseptic or sucrets, as directed on packaging, as needed for discomfort.  Call your regular medical doctor today to schedule a follow up appointment this week.  Return to the Emergency Department immediately if worsening.

## 2014-05-31 LAB — CULTURE, GROUP A STREP: Strep A Culture: NEGATIVE

## 2014-10-22 ENCOUNTER — Encounter (HOSPITAL_COMMUNITY): Payer: Self-pay | Admitting: *Deleted

## 2014-10-22 ENCOUNTER — Inpatient Hospital Stay (HOSPITAL_COMMUNITY)
Admission: AD | Admit: 2014-10-22 | Discharge: 2014-10-22 | Disposition: A | Discharge status: Home / Self Care | Payer: Medicaid Other | Source: Ambulatory Visit | Attending: Obstetrics and Gynecology | Admitting: Obstetrics and Gynecology

## 2014-10-22 DIAGNOSIS — K5900 Constipation, unspecified: Secondary | ICD-10-CM | POA: Insufficient documentation

## 2014-10-22 DIAGNOSIS — R1031 Right lower quadrant pain: Secondary | ICD-10-CM | POA: Diagnosis not present

## 2014-10-22 DIAGNOSIS — R109 Unspecified abdominal pain: Secondary | ICD-10-CM | POA: Diagnosis present

## 2014-10-22 LAB — URINALYSIS, ROUTINE W REFLEX MICROSCOPIC
BILIRUBIN URINE: NEGATIVE
Glucose, UA: NEGATIVE mg/dL
Hgb urine dipstick: NEGATIVE
Ketones, ur: NEGATIVE mg/dL
LEUKOCYTES UA: NEGATIVE
NITRITE: NEGATIVE
Protein, ur: NEGATIVE mg/dL
SPECIFIC GRAVITY, URINE: 1.015 (ref 1.005–1.030)
Urobilinogen, UA: 1 mg/dL (ref 0.0–1.0)
pH: 6.5 (ref 5.0–8.0)

## 2014-10-22 LAB — POCT PREGNANCY, URINE: PREG TEST UR: NEGATIVE

## 2014-10-22 MED ORDER — IBUPROFEN 600 MG PO TABS
600.0000 mg | ORAL_TABLET | Freq: Once | ORAL | Status: AC
  Administered 2014-10-22: 600 mg via ORAL
  Filled 2014-10-22: qty 1

## 2014-10-22 MED ORDER — IBUPROFEN 600 MG PO TABS: 600.0000 mg | ORAL_TABLET | Freq: Four times a day (QID) | ORAL | Status: AC | PRN

## 2014-10-22 MED ORDER — CYCLOBENZAPRINE HCL 5 MG PO TABS: 5.0000 mg | ORAL_TABLET | Freq: Three times a day (TID) | ORAL | Status: DC | PRN

## 2014-10-22 MED ORDER — POLYETHYLENE GLYCOL 3350 17 GM/SCOOP PO POWD: 1.0000 | Freq: Two times a day (BID) | ORAL | Status: DC

## 2014-10-22 NOTE — MAU Provider Note (Signed)
History     CSN: [id number] Arrival date and time: 10/22/14 1039 First Provider Initiated Contact with Patient 10/22/14 1116      Chief Complaint  Patient presents with  . Abdominal Pain   HPI  Alexis Byrd is a 24 y.o. presenting with abdominal pain. Sharp right-sided pain, reports chronic problem since she was teenager. Pain started 2 weeks ago but this episode is lasting longer than previous episodes. Pain is lowed on the RLQ, points under pannus about 5cm from mons. Pain is worse with urination and worse with BMs. Intermittent in nature, worse at night.  Comes to ED because it has not gone away but otherwise the pain is exactly how it has been for the past 2 weeks and with prior episodes.  She was seen for this by [facility] medical- they "did not do anything". Located in right lower abdomen. Denies nausea, vomiting.  Denies other abdominal pain. Reports vaginal discharge- recently diagnosed with BV and completed course of flagyl. Nexplanon is in place, placed in March 2015.  She google abdominal pain and she is worried about her ovary.   Reports she has tried tylenol and advil. Reports these help and she was able to sleep. Reports when she woke up the pain was "horrible".  Reports constipation- needs milk of mag every other day to have BM.  Last BM yesterday, stools are hard to pass  Denies dysuria. Reports increased urination.   OB History    Gravida Para Term Preterm AB TAB SAB Ectopic Multiple Living   History reviewed. No pertinent past medical history.  Past Surgical History  Procedure Laterality Date  . Cesarean section      History reviewed. No pertinent family history.  Social History  Substance Use Topics  . Smoking status: Never Smoker   . Smokeless tobacco: None  . Alcohol Use: No    Allergies:  Allergies  Allergen Reactions  . Sulfa Antibiotics Hives    Prescriptions prior to admission  Medication Sig Dispense Refill Last Dose   . acetaminophen-codeine 120-12 MG/5ML solution Take 10 mLs by mouth every 4 (four) hours as needed for moderate pain. (Patient not taking: Reported on 02/18/2014) 120 mL 0   . etonogestrel (NEXPLANON) 68 MG IMPL implant 1 each by Subdermal route once. March 2015     . Guaifenesin 1200 MG TB12 Take 1 tablet (1,200 mg total) by mouth 2 (two) times daily. (Patient not taking: Reported on 02/18/2014) 20 each 0   . metroNIDAZOLE (FLAGYL) 500 MG tablet Take 1 tablet (500 mg total) by mouth 2 (two) times daily. 14 tablet 0   . predniSONE (DELTASONE) 50 MG tablet Take 1 tablet (50 mg total) by mouth daily. (Patient not taking: Reported on 02/18/2014) 5 tablet 0     Review of Systems  Constitutional: Negative for fever and chills.  Eyes: Negative for blurred vision and double vision.  Respiratory: Negative for cough and shortness of breath.   Cardiovascular: Negative for chest pain and orthopnea.  Gastrointestinal: Positive for nausea and abdominal pain. Negative for heartburn, vomiting, diarrhea, blood in stool and melena.  Genitourinary: Positive for frequency. Negative for dysuria and flank pain.  Musculoskeletal: Negative for myalgias.  Skin: Negative for rash.  Neurological: Negative for dizziness, tingling, weakness and headaches.  Endo/Heme/Allergies: Does not bruise/bleed easily.  Psychiatric/Behavioral: Negative for depression and suicidal ideas. The patient is not nervous/anxious.    Physical Exam  Blood pressure 106/63, pulse 77, temperature 99.2 F (37.3 C), temperature source Oral, resp. rate 18, weight 188 lb (85.276 kg).  Physical Exam  Nursing note and vitals reviewed. Constitutional: She is oriented to person, place, and time. She appears well-developed and well-nourished. No distress.  Obese female. Walks in without pain. Sitting up without pain, moves around bed without pain.   HENT:  Head: Normocephalic and atraumatic.  Eyes: Conjunctivae are normal. No scleral icterus.   Neck: Normal range of motion. Neck supple.  Cardiovascular: Normal rate and intact distal pulses.   Respiratory: Effort normal. She exhibits no tenderness.  GI: Soft. There is tenderness (TTP to deep palpation in the middle of inguinal canal otherwise abdomen is completely non-tender. ). There is no rebound and no guarding.  Gravid  Genitourinary: Vagina normal.  Musculoskeletal: Normal range of motion. She exhibits no edema.  Neurological: She is alert and oriented to person, place, and time.  Skin: Skin is warm and dry. No rash noted.  Psychiatric: She has a normal mood and affect.    MAU Course  Procedures  MDM UA negative for infection Outpatient TVUS ordered on discharge  Assessment and Plan  Alexis Byrd is a 24 y.o. here with RLQ patient that is chronic, intermittent, non-worsening abdominal pain not associated with nausea/vomiting and exam that is completely benign.  I do not think this necessitates emergent TVUS given chronicity of pain and benign exam. I believe there is a component of chronic constipation and discussed starting bowel regimen with patient. Reviewed plan for outpatient [place] which patient agreed to. Reviewed in detail reasons to return to care and patient voiced understanding. Recommended follow up with outpatient MD in 1 week to assure clinical improvement.    Rx for flexeril for MSK pain, Miralax for bowel regimen  Federico Flake 10/22/2014, 11:16 AM

## 2014-10-22 NOTE — Discharge Instructions (Signed)
Abdominal Pain, Women °Abdominal (stomach, pelvic, or belly) pain can be caused by many things. It is important to tell your doctor: °· The location of the pain. °· Does it come and go or is it present all the time? °· Are there things that start the pain (eating certain foods, exercise)? °· Are there other symptoms associated with the pain (fever, nausea, vomiting, diarrhea)? °All of this is helpful to know when trying to find the cause of the pain. °CAUSES  °· Stomach: virus or bacteria infection, or ulcer. °· Intestine: appendicitis (inflamed appendix), regional ileitis (Crohn's disease), ulcerative colitis (inflamed colon), irritable bowel syndrome, diverticulitis (inflamed diverticulum of the colon), or cancer of the stomach or intestine. °· Gallbladder disease or stones in the gallbladder. °· Kidney disease, kidney stones, or infection. °· Pancreas infection or cancer. °· Fibromyalgia (pain disorder). °· Diseases of the female organs: °¨ Uterus: fibroid (non-cancerous) tumors or infection. °¨ Fallopian tubes: infection or tubal pregnancy. °¨ Ovary: cysts or tumors. °¨ Pelvic adhesions (scar tissue). °¨ Endometriosis (uterus lining tissue growing in the pelvis and on the pelvic organs). °¨ Pelvic congestion syndrome (female organs filling up with blood just before the menstrual period). °¨ Pain with the menstrual period. °¨ Pain with ovulation (producing an egg). °¨ Pain with an IUD (intrauterine device, birth control) in the uterus. °¨ Cancer of the female organs. °· Functional pain (pain not caused by a disease, may improve without treatment). °· Psychological pain. °· Depression. °DIAGNOSIS  °Your doctor will decide the seriousness of your pain by doing an examination. °· Blood tests. °· X-rays. °· Ultrasound. °· CT scan (computed tomography, special type of X-ray). °· MRI (magnetic resonance imaging). °· Cultures, for infection. °· Barium enema (dye inserted in the large intestine, to better view it with  X-rays). °· Colonoscopy (looking in intestine with a lighted tube). °· Laparoscopy (minor surgery, looking in abdomen with a lighted tube). °· Major abdominal exploratory surgery (looking in abdomen with a large incision). °TREATMENT  °The treatment will depend on the cause of the pain.  °· Many cases can be observed and treated at home. °· Over-the-counter medicines recommended by your caregiver. °· Prescription medicine. °· Antibiotics, for infection. °· Birth control pills, for painful periods or for ovulation pain. °· Hormone treatment, for endometriosis. °· Nerve blocking injections. °· Physical therapy. °· Antidepressants. °· Counseling with a psychologist or psychiatrist. °· Minor or major surgery. °HOME CARE INSTRUCTIONS  °· Do not take laxatives, unless directed by your caregiver. °· Take over-the-counter pain medicine only if ordered by your caregiver. Do not take aspirin because it can cause an upset stomach or bleeding. °· Try a clear liquid diet (broth or water) as ordered by your caregiver. Slowly move to a bland diet, as tolerated, if the pain is related to the stomach or intestine. °· Have a thermometer and take your temperature several times a day, and record it. °· Bed rest and sleep, if it helps the pain. °· Avoid sexual intercourse, if it causes pain. °· Avoid stressful situations. °· Keep your follow-up appointments and tests, as your caregiver orders. °· If the pain does not go away with medicine or surgery, you may try: °¨ Acupuncture. °¨ Relaxation exercises (yoga, meditation). °¨ Group therapy. °¨ Counseling. °SEEK MEDICAL CARE IF:  °· You notice certain foods cause stomach pain. °· Your home care treatment is not helping your pain. °· You need stronger pain medicine. °· You want your IUD removed. °· You feel faint or   lightheaded. °· You develop nausea and vomiting. °· You develop a rash. °· You are having side effects or an allergy to your medicine. °SEEK IMMEDIATE MEDICAL CARE IF:  °· Your  pain does not go away or gets worse. °· You have a fever. °· Your pain is felt only in portions of the abdomen. The right side could possibly be appendicitis. The left lower portion of the abdomen could be colitis or diverticulitis. °· You are passing blood in your stools (bright red or black tarry stools, with or without vomiting). °· You have blood in your urine. °· You develop chills, with or without a fever. °· You pass out. °MAKE SURE YOU:  °· Understand these instructions. °· Will watch your condition. °· Will get help right away if you are not doing well or get worse. °Document Released: 11/03/2006 Document Revised: 05/23/2013 Document Reviewed: 11/23/2008 °[employer]® Patient Information ©2015 [employer]. This information is not intended to replace advice given to you by your health care provider. Make sure you discuss any questions you have with your health care provider. ° °

## 2014-10-22 NOTE — MAU Note (Signed)
Thinks she has a cyst on her ovary.  Cramping on rt side.  Comes and goes, off for a wk, on; this time when came back is horrible.

## 2014-10-26 ENCOUNTER — Other Ambulatory Visit (HOSPITAL_COMMUNITY): Payer: Self-pay | Admitting: Family Medicine

## 2014-10-26 ENCOUNTER — Ambulatory Visit (HOSPITAL_COMMUNITY)
Admission: RE | Admit: 2014-10-26 | Discharge: 2014-10-26 | Disposition: A | Discharge status: Home / Self Care | Payer: Medicaid Other | Source: Ambulatory Visit | Attending: Family Medicine | Admitting: Family Medicine

## 2014-10-26 DIAGNOSIS — Q512 Other doubling of uterus: Secondary | ICD-10-CM | POA: Insufficient documentation

## 2014-10-26 DIAGNOSIS — R1031 Right lower quadrant pain: Secondary | ICD-10-CM

## 2014-11-02 ENCOUNTER — Telehealth: Payer: Self-pay | Admitting: General Practice

## 2014-11-02 ENCOUNTER — Telehealth: Payer: Self-pay | Admitting: Family Medicine

## 2014-11-02 NOTE — Telephone Encounter (Signed)
Called patient. SHe said Dr Alvester MorinNewton had tried to call with ultrasound results. Passed on Dr Experiment BlasNewton's message stating there was a small area near the c/s scar which was growing too much but it was not a problem at this time. If she should have trouble getting pregnant in the future it could be re evaluated. Patient voiced understanding.

## 2014-11-02 NOTE — Telephone Encounter (Signed)
1:13 PM Left message for patient to call back regarding US results.   Results showed small area near her CS scar where the lining of her uterus is growing too much. This is not problem currently. If in the future she has any trouble getting pregnant this can be assessed but no need to intervention at this time.   Federico FlakeKimberly Niles Rian Koon, MD, MPH, ABFM Family Medicine, OB Fellow [facility] [place]

## 2014-11-02 NOTE — Telephone Encounter (Signed)
Patient called and left message stating she is returning a missed call from us.

## 2015-03-16 ENCOUNTER — Emergency Department (HOSPITAL_COMMUNITY)
Admission: EM | Admit: 2015-03-16 | Discharge: 2015-03-16 | Disposition: A | Discharge status: Home / Self Care | Payer: Medicaid Other | Attending: Emergency Medicine | Admitting: Emergency Medicine

## 2015-03-16 ENCOUNTER — Encounter (HOSPITAL_COMMUNITY): Payer: Self-pay | Admitting: Emergency Medicine

## 2015-03-16 DIAGNOSIS — Z79899 Other long term (current) drug therapy: Secondary | ICD-10-CM | POA: Insufficient documentation

## 2015-03-16 DIAGNOSIS — J020 Streptococcal pharyngitis: Secondary | ICD-10-CM | POA: Insufficient documentation

## 2015-03-16 LAB — RAPID STREP SCREEN (MED CTR MEBANE ONLY): Streptococcus, Group A Screen (Direct): POSITIVE — AB

## 2015-03-16 MED ORDER — ACETAMINOPHEN 325 MG PO TABS
650.0000 mg | ORAL_TABLET | Freq: Once | ORAL | Status: AC | PRN
  Administered 2015-03-16: 650 mg via ORAL
  Filled 2015-03-16: qty 2

## 2015-03-16 MED ORDER — PENICILLIN G BENZATHINE 1200000 UNIT/2ML IM SUSP
1.2000 10*6.[IU] | Freq: Once | INTRAMUSCULAR | Status: AC
  Administered 2015-03-16: 1.2 10*6.[IU] via INTRAMUSCULAR
  Filled 2015-03-16: qty 2

## 2015-03-16 NOTE — ED Provider Notes (Signed)
CSN: [id number]     Arrival date & time 03/16/15  1020 History   First MD Initiated Contact with Patient 03/16/15 1109     Chief Complaint  Patient presents with  . Sore Throat  . Cough  . Fever     (Consider location/radiation/quality/duration/timing/severity/associated sxs/prior Treatment) HPI Comments: Patient presents today with fever, sore throat, and dry cough.  She reports onset of symptoms yesterday and states that symptoms have been worsening since that time.  She states that the pain in her throat is worse with swallowing.  She has taken Nyquil, Theraflu, and Tylenol PM for her symptoms without improvement.  Last dose of antipyretic was last evening.  She reports associated nausea, but denies vomiting.  Denies SOB, difficulty swallowing, neck pain/stiffness, or headache.  She states that both of her children were recently diagnosed with Influenza.  She denies any known contacts with Strep Throat.    The history is provided by the patient.    History reviewed. No pertinent past medical history. Past Surgical History  Procedure Laterality Date  . Cesarean section     No family history on file. Social History  Substance Use Topics  . Smoking status: Never Smoker   . Smokeless tobacco: None  . Alcohol Use: No   OB History    Gravida Para Term Preterm AB TAB SAB Ectopic Multiple Living   Review of Systems  All other systems reviewed and are negative.     Allergies  Sulfa antibiotics  Home Medications   Prior to Admission medications   Medication Sig Start Date End Date Taking? Authorizing Provider  cyclobenzaprine (FLEXERIL) 5 MG tablet Take 1 tablet (5 mg total) by mouth 3 (three) times daily as needed for muscle spasms. 10/22/14   Federico Flake, MD  etonogestrel (NEXPLANON) 68 MG IMPL implant 1 each by Subdermal route once. March 2015    Historical Provider, MD  ibuprofen (ADVIL,MOTRIN) 600 MG tablet Take 1 tablet (600 mg total) by mouth  every 6 (six) hours as needed. 10/22/14   Federico Flake, MD  polyethylene glycol powder [facility]) powder Take 255 g by mouth 2 (two) times daily. 10/22/14   Federico Flake, MD   BP 118/67 mmHg  Pulse 115  Temp(Src) 100.2 F (37.9 C) (Oral)  Resp 18  SpO2 95% Physical Exam  Constitutional: She appears well-developed and well-nourished.  HENT:  Head: Normocephalic and atraumatic.  Right Ear: Tympanic membrane and ear canal normal.  Left Ear: Tympanic membrane and ear canal normal.  Mouth/Throat: Uvula is midline. No trismus in the jaw. Oropharyngeal exudate, posterior oropharyngeal edema and posterior oropharyngeal erythema present. No tonsillar abscesses.  Patient handling secretions well, no drooling Normal voice phonation. Bilateral tonsillar enlargement.   Airway widely patent.  Neck: Normal range of motion. Neck supple.  Cardiovascular: Normal rate, regular rhythm and normal heart sounds.   Pulmonary/Chest: Effort normal and breath sounds normal.  Musculoskeletal: Normal range of motion.  Lymphadenopathy:    She has cervical adenopathy.  Neurological: She is alert.  Skin: Skin is warm and dry.  Psychiatric: She has a normal mood and affect.  Nursing note and vitals reviewed.   ED Course  Procedures (including critical care time) Labs Review Labs Reviewed  RAPID STREP SCREEN (NOT AT [facility]) - Abnormal; Notable for the following:    Streptococcus, Group A Screen (Direct) POSITIVE (*)    All other components within  normal limits    Imaging Review No results found. I have personally reviewed and evaluated these images and lab results as part of my medical decision-making.   EKG Interpretation None      MDM   Final diagnoses:  None   Patient presents today with sore throat, fever, and dry cough onset yesterday.  Rapid strep is positive.  No signs of PTA or Retropharyngeal Abscess at this time.  Feel that the patient is stable for discharge.   Return precautions given.    Santiago Glad, PA-C 03/16/15 1359  Azalia Bilis, MD 03/16/15 [phone]

## 2015-03-16 NOTE — ED Notes (Signed)
Pt reports cough, fever and sore throat since yesterday. Reports generalized weakness.

## 2015-03-16 NOTE — ED Notes (Signed)
Bed: WA27 Expected date:  Expected time:  Means of arrival:  Comments: 

## 2015-03-17 ENCOUNTER — Emergency Department (HOSPITAL_COMMUNITY)
Admission: EM | Admit: 2015-03-17 | Discharge: 2015-03-17 | Disposition: A | Discharge status: Home / Self Care | Payer: Medicaid Other | Attending: Emergency Medicine | Admitting: Emergency Medicine

## 2015-03-17 ENCOUNTER — Encounter (HOSPITAL_COMMUNITY): Payer: Self-pay | Admitting: Emergency Medicine

## 2015-03-17 DIAGNOSIS — J020 Streptococcal pharyngitis: Secondary | ICD-10-CM | POA: Insufficient documentation

## 2015-03-17 DIAGNOSIS — Z79899 Other long term (current) drug therapy: Secondary | ICD-10-CM | POA: Insufficient documentation

## 2015-03-17 MED ORDER — DEXAMETHASONE SODIUM PHOSPHATE 10 MG/ML IJ SOLN
10.0000 mg | Freq: Once | INTRAMUSCULAR | Status: AC
  Administered 2015-03-17: 10 mg via INTRAVENOUS
  Filled 2015-03-17: qty 1

## 2015-03-17 NOTE — ED Provider Notes (Signed)
CSN: [id number]     Arrival date & time 03/17/15  1431 History  By signing my name below, I, Lyndel Safe, attest that this documentation has been prepared under the direction and in the presence of Tahjae Clausing, PA-C. Electronically Signed: Lyndel Safe, ED Scribe. 03/17/2015. 4:06 PM.   Chief Complaint  Patient presents with  . Sore Throat    The history is provided by the patient. No language interpreter was used.   HPI Comments: Alexis Byrd is a 25 y.o. female who presents to the Emergency Department complaining of a constant, worsening sore throat X 2 days with associated white exudate in throat. The pt was evaluated 1 day ago in this ED for c/o sore throat when she tested positive for strep. She was administered a Bicillin injection and then discharged on ED visit yesterday. She reports it feels like she is swallowing glass when she swallows her secretions, although she is able to tolerate secretions without difficulty and she denies difficulty breathing or swallowing. She had fevers yesterday but this has resolved. She has been taking tylenol at home without relief of sore throat.  She denies worsening of her symptoms. She is concerned because her pain has not resolved after PCN. No other complaints today.  History reviewed. No pertinent past medical history. Past Surgical History  Procedure Laterality Date  . Cesarean section     History reviewed. No pertinent family history. Social History  Substance Use Topics  . Smoking status: Never Smoker   . Smokeless tobacco: None  . Alcohol Use: No   OB History    Gravida Para Term Preterm AB TAB SAB Ectopic Multiple Living   Review of Systems  Constitutional: Negative for fever.  HENT: Positive for sore throat. Negative for trouble swallowing.   Respiratory: Negative for shortness of breath.   All other systems reviewed and are negative.  Allergies  Sulfa antibiotics  Home Medications   Prior to  Admission medications   Medication Sig Start Date End Date Taking? Authorizing Provider  cyclobenzaprine (FLEXERIL) 5 MG tablet Take 1 tablet (5 mg total) by mouth 3 (three) times daily as needed for muscle spasms. 10/22/14   Federico Flake, MD  etonogestrel (NEXPLANON) 68 MG IMPL implant 1 each by Subdermal route once. March 2015    Historical Provider, MD  ibuprofen (ADVIL,MOTRIN) 600 MG tablet Take 1 tablet (600 mg total) by mouth every 6 (six) hours as needed. 10/22/14   Federico Flake, MD  polyethylene glycol powder [facility]) powder Take 255 g by mouth 2 (two) times daily. 10/22/14   Federico Flake, MD   BP 107/72 mmHg  Pulse 88  Temp(Src) 98.4 F (36.9 C) (Oral)  Resp 20  SpO2 98% Physical Exam  Constitutional: She is oriented to person, place, and time. She appears well-developed and well-nourished. No distress.  HENT:  Head: Normocephalic.  Mouth/Throat: Uvula is midline. Mucous membranes are not dry. No trismus in the jaw. No uvula swelling. Oropharyngeal exudate, posterior oropharyngeal edema and posterior oropharyngeal erythema present. No tonsillar abscesses.  Bilateral tonsillar erythema, edema and exudate. Patient handling secretions well and airway is patent.  Eyes: Conjunctivae are normal.  Neck: Normal range of motion. Neck supple.  No soft tissue swelling of the neck. FROM intact.   Cardiovascular: Normal rate.   Pulmonary/Chest: Effort normal. No respiratory distress.  Musculoskeletal: Normal range of motion.  Lymphadenopathy:    She  has cervical adenopathy.  Neurological: She is alert and oriented to person, place, and time. Coordination normal.  Skin: Skin is warm.  Psychiatric: She has a normal mood and affect. Her behavior is normal.  Nursing note and vitals reviewed.   ED Course  Procedures  DIAGNOSTIC STUDIES: Oxygen Saturation is 98% on RA, normal by my interpretation.    COORDINATION OF CARE: 4:05 PM Discussed treatment plan  which includes decadron injection with pt. Advised pt to use chloraseptic spray that she can get over the counter. Pt acknowledges and agrees to plan.    MDM   Final diagnoses:  Strep pharyngitis   Patient presenting with throat pain from strep pharyngitis diagnosed and treated with PCN yesterday. Oropharynx with tonsillar exudate and erythema. No uvular swelling or deviation. No trismus. No evidence of PTA or spread of infection to the soft tissues of the neck. Patient handling secretions well and has no SOB or stridor. Given decadron injection. Pt able to tolerate PO in ED. Recommended to follow up with PCP if symptoms do not improve in the next few days. Instructed to continue OTC pain relievers and chloraseptic spray. Return precautions given in discharge paperwork and discussed with pt at bedside. Pt stable for discharge   I personally performed the services described in this documentation, which was scribed in my presence. The recorded information has been reviewed and is accurate.    Alveta Heimlich, PA-C 03/17/15 1616  Marily Memos, MD 03/18/15 [phone]

## 2015-03-17 NOTE — ED Notes (Signed)
Pt came here yesterday and was diagnosed with strep throat. Was given PCN injection. Pt reports her throat isn't feeling any better.

## 2015-03-17 NOTE — Discharge Instructions (Signed)
Use chloraseptic spray for pain relief. Continue using motrin and tylenol for pain relief. Follow up with your PCP if your symptoms do not improve by early next week. Return to ED with difficulty breathing, difficulty swallowing or any other new, worsening or concerning symptoms.    Strep Throat Strep throat is a bacterial infection of the throat. Your health care provider may call the infection tonsillitis or pharyngitis, depending on whether there is swelling in the tonsils or at the back of the throat. Strep throat is most common during the cold months of the year in children who are 57-92 years of age, but it can happen during any season in people of any age. This infection is spread from person to person (contagious) through coughing, sneezing, or close contact. CAUSES Strep throat is caused by the bacteria called Streptococcus pyogenes. RISK FACTORS This condition is more likely to develop in:  People who spend time in crowded places where the infection can spread easily.  People who have close contact with someone who has strep throat. SYMPTOMS Symptoms of this condition include:  Fever or chills.   Redness, swelling, or pain in the tonsils or throat.  Pain or difficulty when swallowing.  White or yellow spots on the tonsils or throat.  Swollen, tender glands in the neck or under the jaw.  Red rash all over the body (rare). DIAGNOSIS This condition is diagnosed by performing a rapid strep test or by taking a swab of your throat (throat culture test). Results from a rapid strep test are usually ready in a few minutes, but throat culture test results are available after one or two days. TREATMENT This condition is treated with antibiotic medicine. HOME CARE INSTRUCTIONS Medicines  Take over-the-counter and prescription medicines only as told by your health care provider.  Take your antibiotic as told by your health care provider. Do not stop taking the antibiotic even if you  start to feel better.  Have family members who also have a sore throat or fever tested for strep throat. They may need antibiotics if they have the strep infection. Eating and Drinking  Do not share food, drinking cups, or personal items that could cause the infection to spread to other people.  If swallowing is difficult, try eating soft foods until your sore throat feels better.  Drink enough fluid to keep your urine clear or pale yellow. General Instructions  Gargle with a salt-water mixture 3-4 times per day or as needed. To make a salt-water mixture, completely dissolve -1 tsp of salt in 1 cup of warm water.  Make sure that all household members wash their hands well.  Get plenty of rest.  Stay home from school or work until you have been taking antibiotics for 24 hours.  Keep all follow-up visits as told by your health care provider. This is important. SEEK MEDICAL CARE IF:  The glands in your neck continue to get bigger.  You develop a rash, cough, or earache.  You cough up a thick liquid that is green, yellow-brown, or bloody.  You have pain or discomfort that does not get better with medicine.  Your problems seem to be getting worse rather than better.  You have a fever. SEEK IMMEDIATE MEDICAL CARE IF:  You have new symptoms, such as vomiting, severe headache, stiff or painful neck, chest pain, or shortness of breath.  You have severe throat pain, drooling, or changes in your voice.  You have swelling of the neck, or the skin  on the neck becomes red and tender.  You have signs of dehydration, such as fatigue, dry mouth, and decreased urination.  You become increasingly sleepy, or you cannot wake up completely.  Your joints become red or painful.   This information is not intended to replace advice given to you by your health care provider. Make sure you discuss any questions you have with your health care provider.   Document Released: 01/04/2000 Document  Revised: 09/27/2014 Document Reviewed: 05/01/2014 Elsevier Interactive Patient Education [employer]

## 2015-03-17 NOTE — ED Notes (Signed)
Pt received Decadron IM, not IV in the right lateral glute.  PA, Alveta Heimlich, gave verbal order to RN to change route to IM from IV.

## 2015-09-25 ENCOUNTER — Encounter (HOSPITAL_COMMUNITY): Payer: Self-pay | Admitting: *Deleted

## 2015-09-25 ENCOUNTER — Inpatient Hospital Stay (HOSPITAL_COMMUNITY)
Admission: AD | Admit: 2015-09-25 | Discharge: 2015-09-25 | Disposition: A | Discharge status: Home / Self Care | Payer: Medicaid Other | Source: Ambulatory Visit | Attending: Family Medicine | Admitting: Family Medicine

## 2015-09-25 DIAGNOSIS — M545 Low back pain, unspecified: Secondary | ICD-10-CM

## 2015-09-25 DIAGNOSIS — E860 Dehydration: Secondary | ICD-10-CM | POA: Diagnosis not present

## 2015-09-25 DIAGNOSIS — Z882 Allergy status to sulfonamides status: Secondary | ICD-10-CM | POA: Insufficient documentation

## 2015-09-25 DIAGNOSIS — R300 Dysuria: Secondary | ICD-10-CM | POA: Diagnosis present

## 2015-09-25 HISTORY — DX: Other specified health status: Z78.9

## 2015-09-25 LAB — URINALYSIS, ROUTINE W REFLEX MICROSCOPIC
Bilirubin Urine: NEGATIVE
Glucose, UA: NEGATIVE mg/dL
Hgb urine dipstick: NEGATIVE
Ketones, ur: NEGATIVE mg/dL
Leukocytes, UA: NEGATIVE
Nitrite: NEGATIVE
Protein, ur: NEGATIVE mg/dL
Specific Gravity, Urine: 1.02 (ref 1.005–1.030)
pH: 6 (ref 5.0–8.0)

## 2015-09-25 LAB — POCT PREGNANCY, URINE: Preg Test, Ur: NEGATIVE

## 2015-09-25 NOTE — Progress Notes (Signed)
Written and verbal d/c instructions given and understanding voiced. 

## 2015-09-25 NOTE — Discharge Instructions (Signed)
Back Pain, Adult Back pain is very common in adults.The cause of back pain is rarely dangerous and the pain often gets better over time.The cause of your back pain may not be known. Some common causes of back pain include:  Strain of the muscles or ligaments supporting the spine.  Wear and tear (degeneration) of the spinal disks.  Arthritis.  Direct injury to the back. For many people, back pain may return. Since back pain is rarely dangerous, most people can learn to manage this condition on their own. HOME CARE INSTRUCTIONS Watch your back pain for any changes. The following actions may help to lessen any discomfort you are feeling:  Remain active. It is stressful on your back to sit or stand in one place for long periods of time. Do not sit, drive, or stand in one place for more than 30 minutes at a time. Take short walks on even surfaces as soon as you are able.Try to increase the length of time you walk each day.  Exercise regularly as directed by your health care provider. Exercise helps your back heal faster. It also helps avoid future injury by keeping your muscles strong and flexible.  Do not stay in bed.Resting more than 1-2 days can delay your recovery.  Pay attention to your body when you bend and lift. The most comfortable positions are those that put less stress on your recovering back. Always use proper lifting techniques, including:  Bending your knees.  Keeping the load close to your body.  Avoiding twisting.  Find a comfortable position to sleep. Use a firm mattress and lie on your side with your knees slightly bent. If you lie on your back, put a pillow under your knees.  Avoid feeling anxious or stressed.Stress increases muscle tension and can worsen back pain.It is important to recognize when you are anxious or stressed and learn ways to manage it, such as with exercise.  Take medicines only as directed by your health care provider. Over-the-counter  medicines to reduce pain and inflammation are often the most helpful.Your health care provider may prescribe muscle relaxant drugs.These medicines help dull your pain so you can more quickly return to your normal activities and healthy exercise.  Apply ice to the injured area:  Put ice in a plastic bag.  Place a towel between your skin and the bag.  Leave the ice on for 20 minutes, 2-3 times a day for the first 2-3 days. After that, ice and heat may be alternated to reduce pain and spasms.  Maintain a healthy weight. Excess weight puts extra stress on your back and makes it difficult to maintain good posture. SEEK MEDICAL CARE IF:  You have pain that is not relieved with rest or medicine.  You have increasing pain going down into the legs or buttocks.  You have pain that does not improve in one week.  You have night pain.  You lose weight.  You have a fever or chills. SEEK IMMEDIATE MEDICAL CARE IF:   You develop new bowel or bladder control problems.  You have unusual weakness or numbness in your arms or legs.  You develop nausea or vomiting.  You develop abdominal pain.  You feel faint.   This information is not intended to replace advice given to you by your health care provider. Make sure you discuss any questions you have with your health care provider.   Document Released: 01/06/2005 Document Revised: 01/27/2014 Document Reviewed: 05/10/2013 Elsevier Interactive Patient Education 2016 Else[employer]   Inc[employer] Dehydration, Adult Dehydration is a condition in which you do not have enough fluid or water in your body. It happens when you take in less fluid than you lose. Vital organs such as the kidneys, brain, and heart cannot function without a proper amount of fluids. Any loss of fluids from the body can cause dehydration.  Dehydration can range from mild to severe. This condition should be treated right away to help prevent it from becoming severe. CAUSES  This condition may  be caused by:  Vomiting.  Diarrhea.  Excessive sweating, such as when exercising in hot or humid weather.  Not drinking enough fluid during strenuous exercise or during an illness.  Excessive urine output.  Fever.  Certain medicines. RISK FACTORS This condition is more likely to develop in:  People who are taking certain medicines that cause the body to lose excess fluid (diuretics).   People who have a chronic illness, such as diabetes, that may increase urination.  Older adults.   People who live at high altitudes.   People who participate in endurance sports.  SYMPTOMS  Mild Dehydration  Thirst.  Dry lips.  Slightly dry mouth.  Dry, warm skin. Moderate Dehydration  Very dry mouth.   Muscle cramps.   Dark urine and decreased urine production.   Decreased tear production.   Headache.   Light-headedness, especially when you stand up from a sitting position.  Severe Dehydration  Changes in skin.   Cold and clammy skin.   Skin does not spring back quickly when lightly pinched and released.   Changes in body fluids.   Extreme thirst.   No tears.   Not able to sweat when body temperature is high, such as in hot weather.   Minimal urine production.   Changes in vital signs.   Rapid, weak pulse (more than 100 beats per minute when you are sitting still).   Rapid breathing.   Low blood pressure.   Other changes.   Sunken eyes.   Cold hands and feet.   Confusion.  Lethargy and difficulty being awakened.  Fainting (syncope).   Short-term weight loss.   Unconsciousness. DIAGNOSIS  This condition may be diagnosed based on your symptoms. You may also have tests to determine how severe your dehydration is. These tests may include:   Urine tests.   Blood tests.  TREATMENT  Treatment for this condition depends on the severity. Mild or moderate dehydration can often be treated at home. Treatment should be  started right away. Do not wait until dehydration becomes severe. Severe dehydration needs to be treated at the hospital. Treatment for Mild Dehydration  Drinking plenty of water to replace the fluid you have lost.   Replacing minerals in your blood (electrolytes) that you may have lost.  Treatment for Moderate Dehydration  Consuming oral rehydration solution (ORS). Treatment for Severe Dehydration  Receiving fluid through an IV tube.   Receiving electrolyte solution through a feeding tube that is passed through your nose and into your stomach (nasogastric tube or NG tube).  Correcting any abnormalities in electrolytes. HOME CARE INSTRUCTIONS   Drink enough fluid to keep your urine clear or pale yellow.   Drink water or fluid slowly by taking small sips. You can also try sucking on ice cubes.  Have food or beverages that contain electrolytes. Examples include bananas and sports drinks.  Take over-the-counter and prescription medicines only as told by your health care provider.   Prepare ORS according to the manufacturer's instructions.   Take sips of ORS every 5 minutes until your urine returns to normal.  If you have vomiting or diarrhea, continue to try to drink water, ORS, or both.   If you have diarrhea, avoid:   Beverages that contain caffeine.   Fruit juice.   Milk.   Carbonated soft drinks.  Do not take salt tablets. This can lead to the condition of having too much sodium in your body (hypernatremia).  SEEK MEDICAL CARE IF:  You cannot eat or drink without vomiting.  You have had moderate diarrhea during a period of more than 24 hours.  You have a fever. SEEK IMMEDIATE MEDICAL CARE IF:   You have extreme thirst.  You have severe diarrhea.  You have not urinated in 6-8 hours, or you have urinated only a small amount of very dark urine.  You have shriveled skin.  You are dizzy, confused, or both.   This information is not intended to  replace advice given to you by your health care provider. Make sure you discuss any questions you have with your health care provider.   Document Released: 01/06/2005 Document Revised: 09/27/2014 Document Reviewed: 05/24/2014 Elsevier Interactive Patient Education 2016 Elsevier Inc.  

## 2015-09-25 NOTE — MAU Note (Signed)
Has had UTI symptoms for two weeks, back pain, burning with urination, difficulty emptying her bladder.  Denies discharge or vb.

## 2015-09-25 NOTE — MAU Provider Note (Signed)
History     CSN: [id number]  Arrival date and time: 09/25/15 1429   None     Chief Complaint  Patient presents with  . Dysuria  . Back Pain   Non-pregnant female c/o back pain and odorous urine x2 days. She also endorses urinary urgency and frequency. She denies dysuria, hematuria, and fever. She had similar sx 2 weeks ago and treated with AZO and had resolution for some time. She admits to little to no water intake and drinks mostly juice and sodas. She describes the pain as bilateral and lower. She denies any recent injury or strain. She hasn't used anything for the pain. She denies vaginal discharge and pelvic pain.     Pertinent Gynecological History: Menses: none Contraception: Nexplanon  Past Medical History:  Diagnosis Date  . Medical history non-contributory     Past Surgical History:  Procedure Laterality Date  . CESAREAN SECTION      History reviewed. No pertinent family history.  Social History  Substance Use Topics  . Smoking status: Never Smoker  . Smokeless tobacco: Never Used  . Alcohol use No    Allergies:  Allergies  Allergen Reactions  . Sulfa Antibiotics Hives    Prescriptions Prior to Admission  Medication Sig Dispense Refill Last Dose  . Aspirin-Acetaminophen-Caffeine (GOODY HEADACHE PO) Take 1 packet by mouth as needed (headache).   09/24/2015 at Unknown time  . cyclobenzaprine (FLEXERIL) 5 MG tablet Take 1 tablet (5 mg total) by mouth 3 (three) times daily as needed for muscle spasms. (Patient not taking: Reported on 09/25/2015) 30 tablet 0 Not Taking at Unknown time  . etonogestrel (NEXPLANON) 68 MG IMPL implant 1 each by Subdermal route once. March 2015     . ibuprofen (ADVIL,MOTRIN) 600 MG tablet Take 1 tablet (600 mg total) by mouth every 6 (six) hours as needed. (Patient not taking: Reported on 09/25/2015) 60 tablet 3 Not Taking at Unknown time  . polyethylene glycol powder (GLYCOLAX/MIRALAX) powder Take 255 g by mouth 2 (two) times daily.  (Patient not taking: Reported on 09/25/2015) 850 g 1 Not Taking at Unknown time    Review of Systems  Constitutional: Negative.   Genitourinary: Positive for frequency and urgency. Negative for dysuria, flank pain and hematuria.  Musculoskeletal: Positive for back pain.   Physical Exam   Blood pressure 110/66, pulse 72, temperature 98.7 F (37.1 C), resp. rate 16, SpO2 99 %.  Physical Exam  Constitutional: She is oriented to person, place, and time. She appears well-developed and well-nourished.  HENT:  Head: Normocephalic and atraumatic.  Neck: Normal range of motion. Neck supple.  Cardiovascular: Normal rate.   GI: There is no CVA tenderness.  Musculoskeletal: Normal range of motion. She exhibits no tenderness or deformity.       Thoracic back: She exhibits normal range of motion, no tenderness, no bony tenderness and no swelling.       Lumbar back: She exhibits normal range of motion, no tenderness, no bony tenderness and no swelling.  Neurological: She is alert and oriented to person, place, and time.  Skin: Skin is warm and dry.  Psychiatric: She has a normal mood and affect.    Results for orders placed or performed during the hospital encounter of 09/25/15 (from the past 24 hour(s))  Urinalysis, Routine w reflex microscopic ([facility])     Status: None   Collection Time: 09/25/15  3:05 PM  Result Value Ref Range   Color, Urine YELLOW YELLOW  APPearance CLEAR CLEAR   Specific Gravity, Urine 1.020 1.005 - 1.030   pH 6.0 5.0 - 8.0   Glucose, UA NEGATIVE NEGATIVE mg/dL   Hgb urine dipstick NEGATIVE NEGATIVE   Bilirubin Urine NEGATIVE NEGATIVE   Ketones, ur NEGATIVE NEGATIVE mg/dL   Protein, ur NEGATIVE NEGATIVE mg/dL   Nitrite NEGATIVE NEGATIVE   Leukocytes, UA NEGATIVE NEGATIVE  Pregnancy, urine POC     Status: None   Collection Time: 09/25/15  3:40 PM  Result Value Ref Range   Preg Test, Ur NEGATIVE NEGATIVE   MAU Course  Procedures  MDM Labs ordered and  reviewed. Pain likely dehydration vs MSK. No evidence of UTI. UC sent. Stable for discharge home.   Assessment and Plan   1. Dehydration   2. Bilateral low back pain without sciatica    Discharge home Increase water intake to 6 bottles per day Tylenol or Ibuprofen prn pain Follow up with Gyn provider of choice  Donette Larry, CNM 09/25/2015, 4:51 PM

## 2015-09-26 LAB — URINE CULTURE: CULTURE: NO GROWTH

## 2016-02-14 IMAGING — US US PELVIS COMPLETE
1 series · 15 of 25 positions shown · non-contrast
Comparison: None.

CLINICAL DATA: Patient with chronic right lower quadrant pain.

EXAM:
TRANSABDOMINAL AND TRANSVAGINAL ULTRASOUND OF PELVIS
DOPPLER ULTRASOUND OF OVARIES
TECHNIQUE: Both transabdominal and transvaginal ultrasound examinations of the
pelvis were performed. Transabdominal technique was performed for
global imaging of the pelvis including uterus, ovaries, adnexal
regions, and pelvic cul-de-sac.
It was necessary to proceed with endovaginal exam following the
transabdominal exam to visualize the adnexal structures. Color and
duplex Doppler ultrasound was utilized to evaluate blood flow to the
ovaries.

[Series 1: us pelvis complete · 15 of 52 slices shown]
[im 1/52]
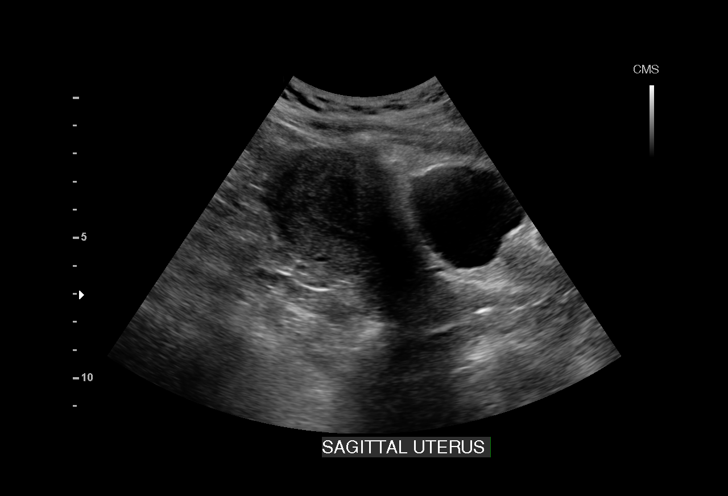
[im 5/52]
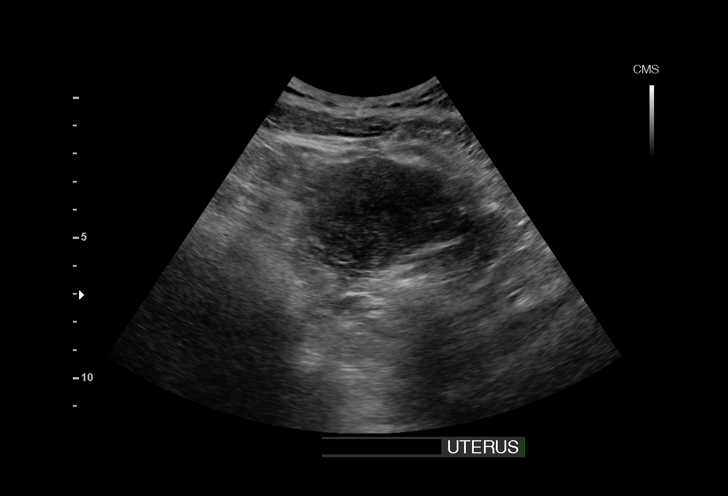
[im 9/52]
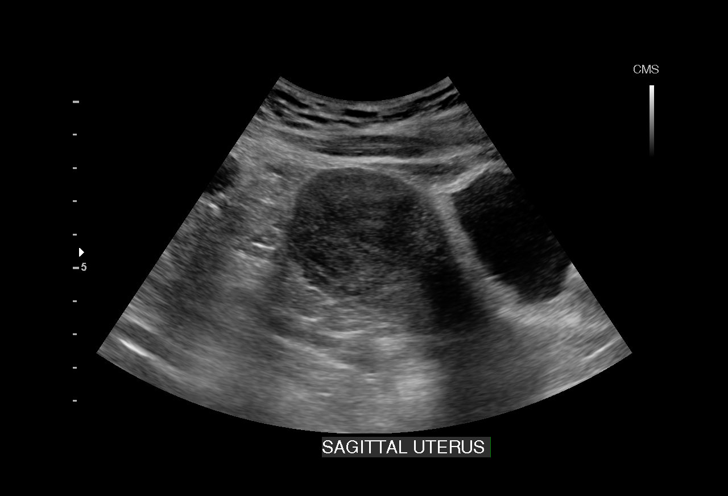
[im 11/52]
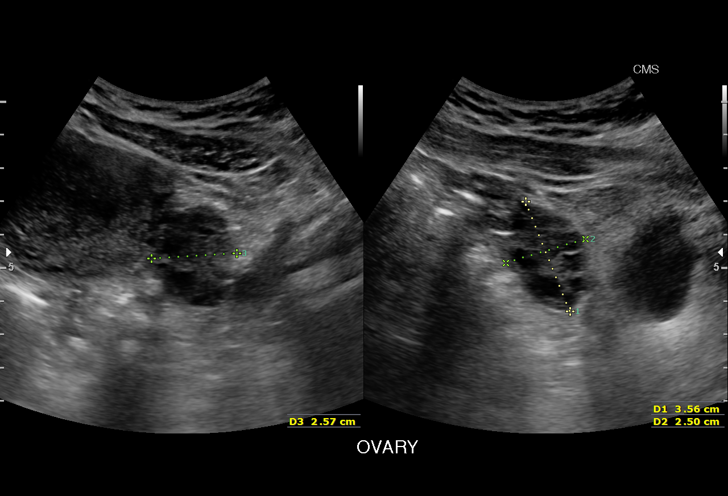
[im 15/52]
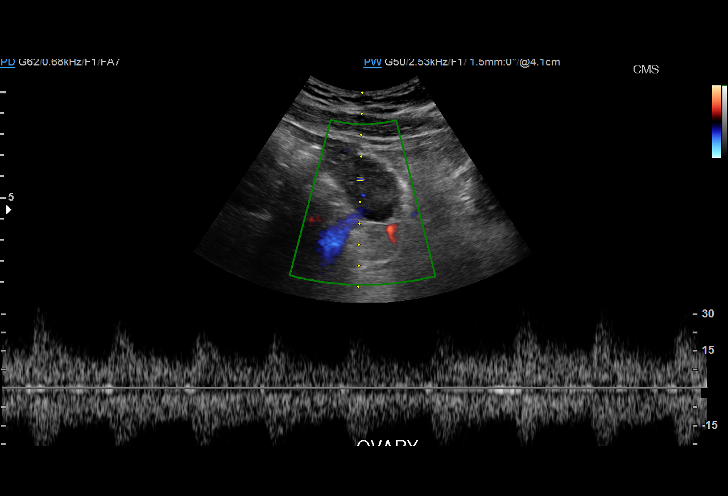
[im 20/52]
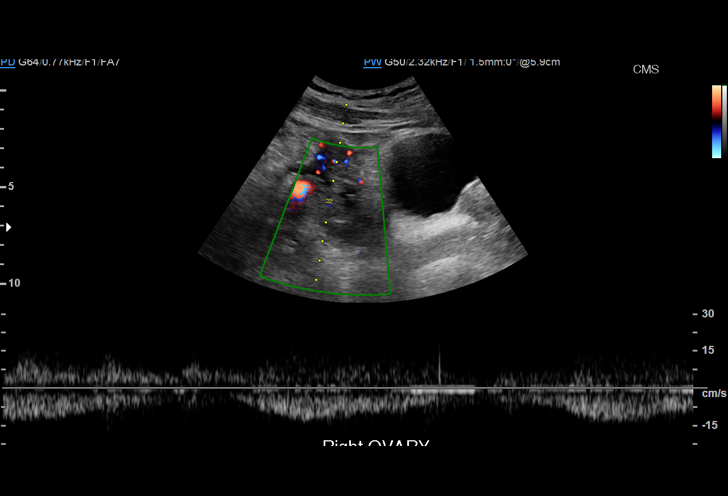
[im 22/52]
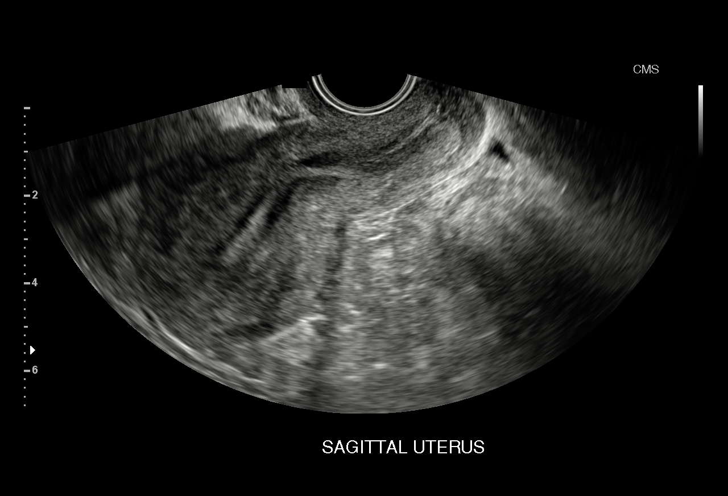
[im 26/52]
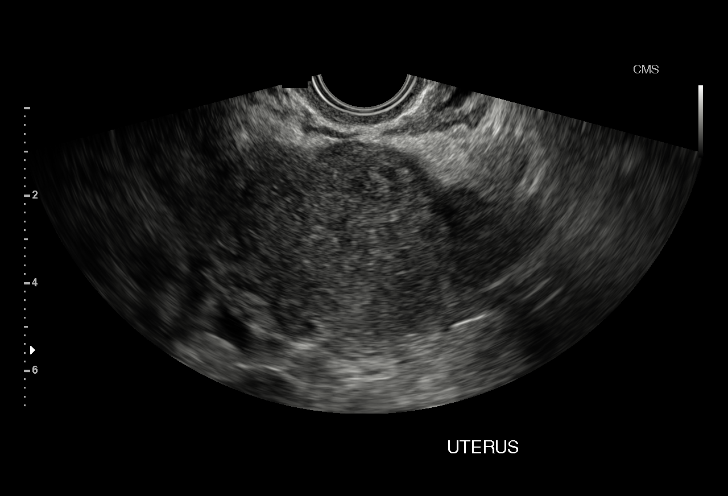
[im 30/52]
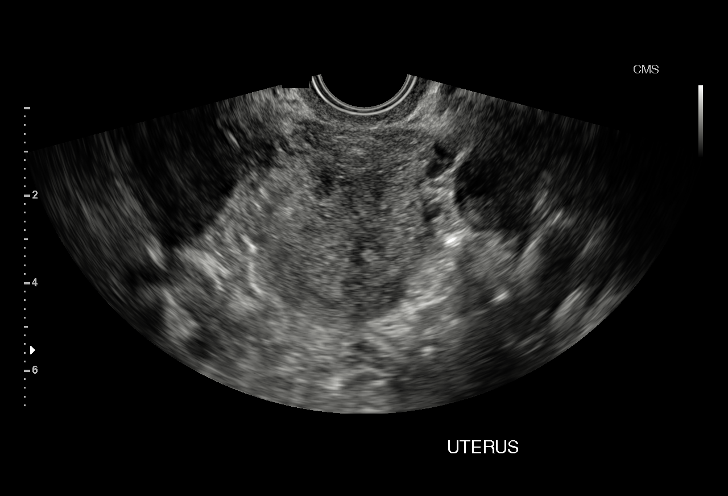
[im 32/52]
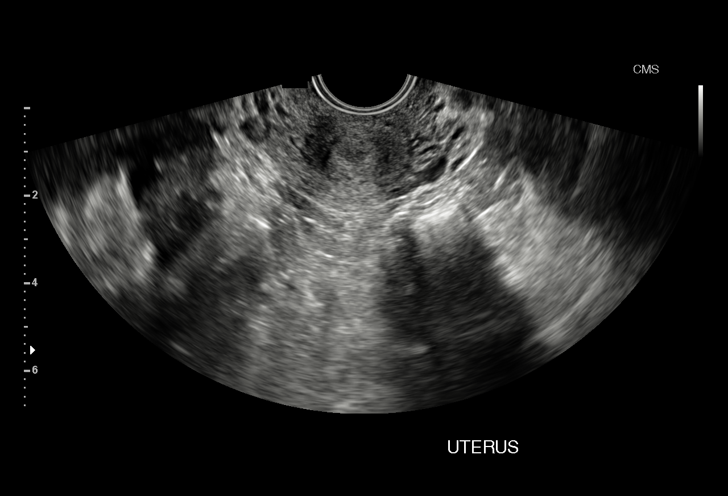
[im 37/52]
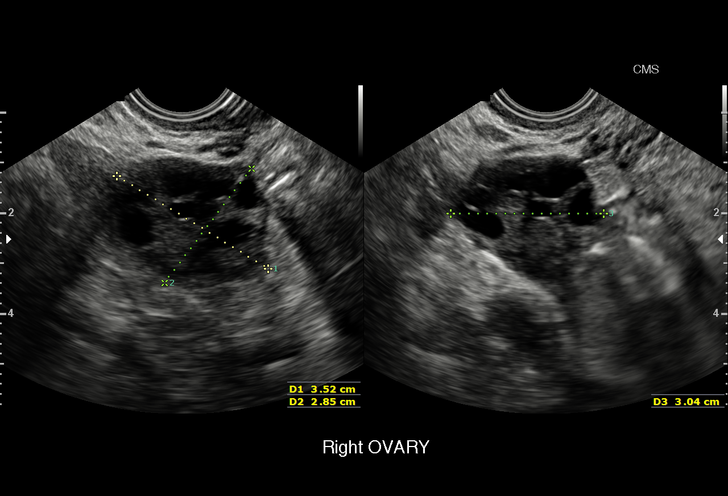
[im 41/52]
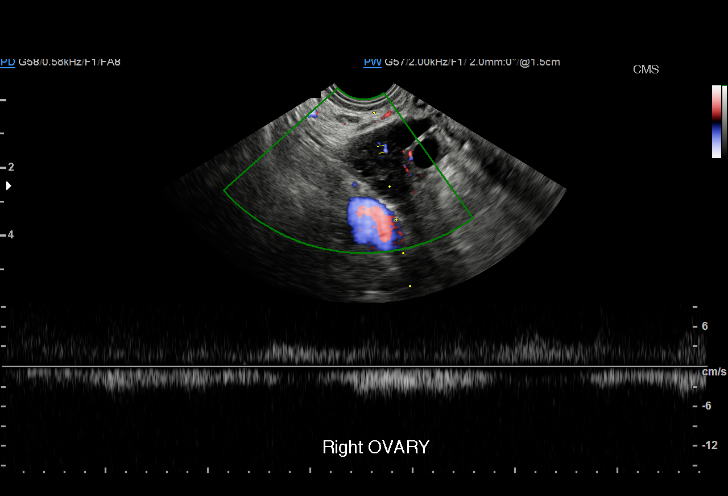
[im 43/52]
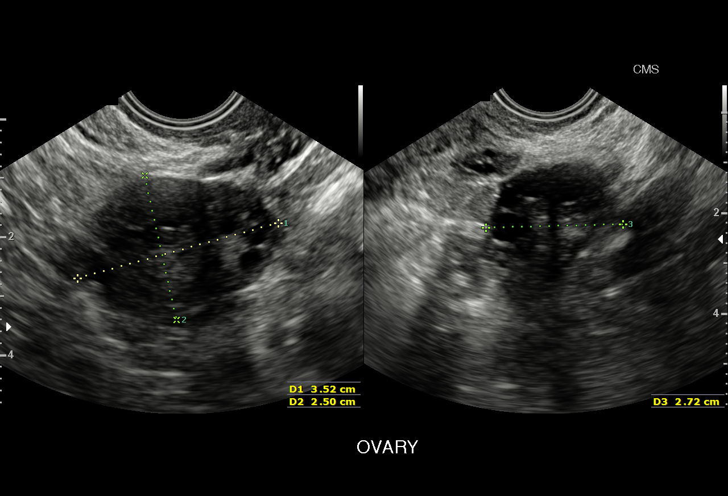
[im 47/52]
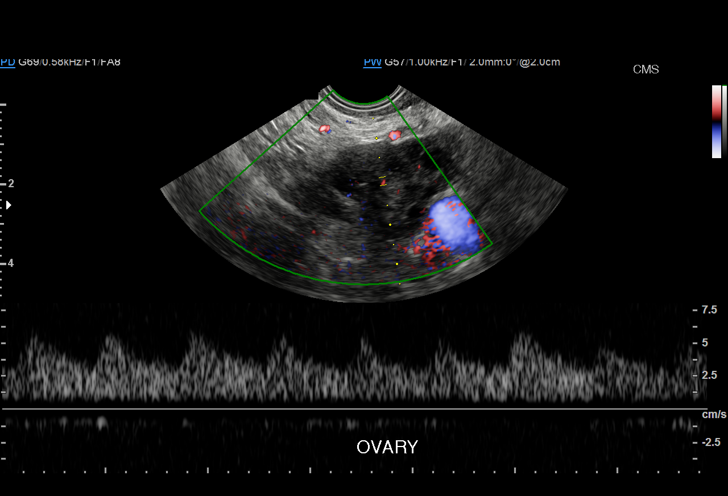
[im 52/52]
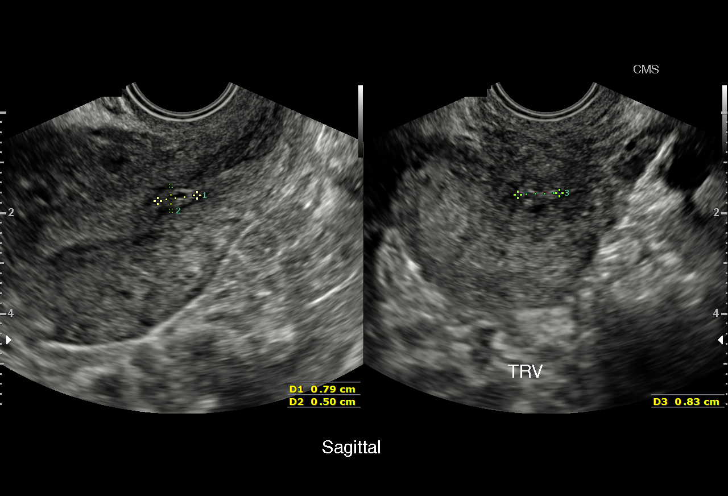

[15 of 25 positions shown; findings below may reference images not displayed]

FINDINGS: Uterus

Measurements: 9.1 x 4.8 x 5.5 cm. Suggestion of partially septate
uterus.

Endometrium

Thickness: 7 mm. Within the lower uterine segment there is
suggestion of an 8 mm predominantly echogenic nodular area,
immediately adjacent to the C-section scar.

Right ovary

Measurements: 3.5 x 3.0 x 2.9 cm. Normal appearance/no adnexal mass.

Left ovary

Measurements: 3.5 x 2.7 x 2.5 cm. Normal appearance/no adnexal mass.

Pulsed Doppler evaluation of both ovaries demonstrates normal
low-resistance arterial and venous waveforms.

Other findings

No free fluid.
IMPRESSION: The ovaries are normal in appearance without sonographic evidence to
suggest torsion.

Suggestion of a partially septate uterus.

Within the lower uterine segment there is suggestion of a
predominately echogenic 8 mm masslike lesion near the C-section
scar. This may potentially represent postsurgical changes or
endometrial mass with considerations including polyp or focal
hyperplasia. Consider sonohysterography for further evaluation,
prior to hysteroscopy or endometrial biopsy.

## 2018-01-20 LAB — COMPREHENSIVE METABOLIC PANEL
ALT: 9 U/L (ref 0–33)
AST: 11 U/L (ref 0–32)
Albumin/Globulin Ratio: 1 mmol/L (ref 1.00–2.00)
Albumin: 3.9 g/dL (ref 3.5–5.2)
Alk Phosphatase: 66 U/L (ref 35–117)
Anion Gap: 12 mmol/L (ref 2–17)
BUN: 7 mg/dL (ref 6–20)
CO2: 23 mmol/L (ref 22–29)
Calcium: 9.1 mg/dL (ref 8.6–10.0)
Chloride: 99 mmol/L (ref 98–107)
Creatinine: 0.5 mg/dL (ref 0.5–0.9)
GFR African American: 153 mL/min/{1.73_m2} (ref >=90)
GFR Non-African American: 132 mL/min/{1.73_m2} (ref >=90)
Globulin: 4 g/dL (ref 1.9–4.4)
Glucose: 89 mg/dL (ref 70–99)
OSMOLALITY CALCULATED: 266 mOsm/kg — ABNORMAL LOW (ref 270–287)
Potassium: 3.8 mmol/L (ref 3.5–5.3)
Sodium: 134 mmol/L — ABNORMAL LOW (ref 135–145)
Total Bilirubin: 0.33 mg/dL (ref 0.00–1.20)
Total Protein: 8.2 g/dL (ref 6.4–8.3)

## 2018-01-20 LAB — POC URINALYSIS, CHEMISTRY
Bilirubin, Urine, POC: NEGATIVE
Glucose, UA POC: NEGATIVE mg/dL
Ketones, Urine, POC: NEGATIVE mg/dL
Leukocytes, UA: NEGATIVE
Nitrate, UA POC: NEGATIVE
Protein, Urine, POC: NEGATIVE
Specific Gravity, Urine, POC: 1.025 (ref 1.003–1.035)
UROBILIN U POC: 0.2 EU/dL
pH, Urine, POC: 6.5 (ref 4.5–8.0)

## 2018-01-20 LAB — CBC
Hematocrit: 34.8 % (ref 34.0–47.0)
Hemoglobin: 11.6 g/dL (ref 11.5–15.7)
MCH: 26.4 pg — ABNORMAL LOW (ref 27.0–34.5)
MCHC: 33.3 g/dL (ref 32.0–36.0)
MCV: 79.4 fL — ABNORMAL LOW (ref 81.0–99.0)
MPV: 8.4 fL (ref 7.2–13.2)
PLT POP: NORMAL
Platelets: 189 10*3/uL (ref 140–440)
RBC POP: NORMAL
RBC: 4.38 x10e6/mcL (ref 3.60–5.20)
RDW: 13.5 % (ref 11.0–16.0)
WBC POP: ABNORMAL — AB
WBC: 9.4 10*3/uL (ref 3.8–10.6)

## 2018-01-20 LAB — POC PREGNANCY UR-QUAL: Preg Test, Ur: NEGATIVE

## 2018-01-20 LAB — PROTIME-INR
INR: 1.1 — ABNORMAL LOW (ref 1.5–3.5)
Protime: 14.1 seconds (ref 11.6–14.5)

## 2018-01-20 NOTE — ED Notes (Signed)
ED Patient Education Note     Patient Education Materials Follows:  Obstetrics and Gynecology     Deep Vein Thrombosis    A deep vein thrombosis (DVT) is a blood clot (thrombus) that usually occurs in a deep, larger vein of the lower leg or the pelvis, or in an upper extremity such as the arm. These are dangerous and can lead to serious and even life-threatening complications if the clot travels to the lungs.    A DVT can damage the valves in your leg veins so that instead of flowing upward, the blood pools in the lower leg. This is called post-thrombotic syndrome, and it can result in pain, swelling, discoloration, and sores on the leg.      CAUSES    A DVT is caused by the formation of a blood clot in your leg, pelvis, or arm. Usually, several things contribute to the formation of blood clots. A clot may develop when:     Your blood flow slows down.     Your vein becomes damaged in some way.     You have a condition that makes your blood clot more easily.    RISK FACTORS    A DVT is more likely to develop in:     People who are older, especially over 60 years of age.     People who are overweight (obese).     People who sit or lie still for a long time, such as during long-distance travel (over 4 hours), bed rest, hospitalization, or during recovery from certain medical conditions like a stroke.     People who do not engage in much physical activity (sedentary lifestyle).     People who have chronic breathing disorders.     People who have a personal or family history of blood clots or blood clotting disease.     People who have peripheral vascular disease (PVD), diabetes, or some types of cancer.     People who have heart disease, especially if the person had a recent heart attack or has congestive heart failure.     People who have neurological diseases that affect the legs (leg paresis).     People who have had a traumatic injury, such as breaking a hip or leg.     People who have recently had major or lengthy  surgery, especially on the hip, knee, or abdomen.     People who have had a central line placed inside a large vein.     People who take medicines that contain the hormone estrogen. These include birth control pills and hormone replacement therapy.     Pregnancy or during childbirth or the postpartum period.       Long plane flights (over 8 hours).    SIGNS AND SYMPTOMS    Symptoms of a DVT can include:      Swelling of your leg or arm, especially if one side is much worse.     Warmth and redness of your leg or arm, especially if one side is much worse.     Pain in your arm or leg. If the clot is in your leg, symptoms may be more noticeable or worse when you stand or walk.     A feeling of pins and needles, if the clot is in the arm.    The symptoms of a DVT that has traveled to the lungs (pulmonary embolism, PE) usually start suddenly and include:     Shortness of breath   while active or at rest.     Coughing or coughing up blood or blood-tinged mucus.     Chest pain that is often worse with deep breaths.     Rapid or irregular heartbeat.     Feeling light-headed or dizzy.     Fainting.     Feeling anxious.     Sweating.    There may also be pain and swelling in a leg if that is where the blood clot started.    These symptoms may represent a serious problem that is an emergency. Do not wait to see if the symptoms will go away. Get medical help right away. Call your local emergency services (911 in the [place] Do not drive yourself to the hospital.    DIAGNOSIS    Your health care provider will take a medical history and perform a physical exam. You may also have other tests, including:     Blood tests to assess the clotting properties of your blood.     Imaging tests, such as CT, ultrasound, MRI, X-ray, and other tests to see if you have clots anywhere in your body.    TREATMENT    After a DVT is identified, it can be treated. The type of treatment that you receive depends on many factors, such as the cause of your  DVT, your risk for bleeding or developing more clots, and other medical conditions that you have. Sometimes, a combination of treatments is necessary. Treatment options may be combined and include:     Monitoring the blood clot with ultrasound.     Taking medicines by mouth, such as newer blood thinners (anticoagulants), thrombolytics, or warfarin.     Taking anticoagulant medicine by injection or through an IV tube.     Wearing compression stockings or using different types of?devices.     Surgery (rare) to remove the blood clot or to place a filter in your abdomen to stop the blood clot from traveling to your lungs.    Treatments for a DVT are often divided into immediate treatment and long-term treatment (up to 3 months after DVT). You can work with your health care provider to choose the treatment program that is best for you.    HOME CARE INSTRUCTIONS    If you are taking a newer oral anticoagulant:     Take the medicine every single day at the same time each day.     Understand what foods and drugs interact with this medicine.     Understand that there are no regular blood tests required when using this medicine.     Understand the side effects of this medicine, including excessive bruising or bleeding. Ask your health care provider or pharmacist about other possible side effects.    If you are taking warfarin:     Understand how to take warfarin and know which foods can affect how warfarin works in your body.      Understand that it is dangerous to take too much or too little warfarin. Too much warfarin increases the risk of bleeding. Too little warfarin continues to allow the risk for blood clots.     Follow your PT and INR blood testing schedule. The PT and INR results allow your health care provider to adjust your dose of warfarin. It is very important that you have your PT and INR tested as often as told by your health care provider.     Avoid major changes in your diet, or tell   your health care provider  before you change your diet. Arrange a visit with a registered dietitian to answer your questions. Many foods, especially foods that are high in vitamin K, can interfere with warfarin and affect the PT and INR results. Eat a consistent amount of foods that are high in vitamin K, such as:    ? Spinach, kale, broccoli, cabbage, collard greens, turnip greens, Brussels sprouts, peas, cauliflower, seaweed, and parsley.    ? Beef liver and pork liver.    ? Green tea.    ? Soybean oil.     Tell your health care provider about any and all medicines, vitamins, and supplements that you take, including aspirin and other over-the-counter anti-inflammatory medicines. Be especially cautious with aspirin and anti-inflammatory medicines. Do not take those before you ask your health care provider if it is safe to do so. This is important because many medicines can interfere with warfarin and affect the PT and INR results.     Do not start or stop taking any over-the-counter or prescription medicine unless your health care provider or pharmacist tells you to do so.    If you take warfarin, you will also need to do these things:     Hold pressure over cuts for longer than usual.      Tell your dentist and other health care providers that you are taking warfarin before you have any procedures in which bleeding may occur.     Avoid alcohol or drink very small amounts. Tell your health care provider if you change your alcohol intake.     Do not use tobacco products, including cigarettes, chewing tobacco, and e-cigarettes. If you need help quitting, ask your health care provider.     Avoid contact sports.    General Instructions     Take over-the-counter and prescription medicines only as told by your health care provider. Anticoagulant medicines can have side effects, including easy bruising and difficulty stopping bleeding. If you are prescribed an anticoagulant, you will also need to do these things:    ? Hold pressure over cuts for  longer than usual.    ? Tell your dentist and other health care providers that you are taking anticoagulants before you have any procedures in which bleeding may occur.     ? Avoid contact sports.      Wear a medical alert bracelet or carry a medical alert card that says you have had a PE.     Ask your health care provider how soon you can go back to your normal activities. Stay active to prevent new blood clots from forming.     Make sure to exercise while traveling or when you have been sitting or standing for a long period of time. It is very important to exercise. Exercise your legs by walking or by tightening and relaxing your leg muscles often. Take frequent walks.     Wear compression stockings as told by your health care provider to help prevent more blood clots from forming.     Do not use tobacco products, including cigarettes, chewing tobacco, and e-cigarettes. If you need help quitting, ask your health care provider.     Keep all follow-up appointments with your health care provider. This is important.    PREVENTION    Take these actions to decrease your risk of developing another DVT:     Exercise regularly. For at least 30 minutes every day, engage in:    ? Activity that involves   moving your arms and legs.    ? Activity that encourages good blood flow through your body by increasing your heart rate.     Exercise your arms and legs every hour during long-distance travel (over 4 hours). Drink plenty of water and avoid drinking alcohol while traveling.     Avoid sitting or lying in bed for long periods of time without moving your legs.     Maintain a weight that is appropriate for your height. Ask your health care provider what weight is healthy for you.     If you are a woman who is over 35 years of age, avoid unnecessary use of medicines that contain estrogen. These include birth control pills.     Do not smoke, especially if you take estrogen medicines. If you need help quitting, ask your health care  provider.    If you are hospitalized, prevention measures may include:     Early walking after surgery, as soon as your health care provider says that it is safe.     Receiving anticoagulants to prevent blood clots.?If you cannot take anticoagulants, other options may be available, such as wearing compression stockings or using different types of devices.    SEEK IMMEDIATE MEDICAL CARE IF:     You have new or increased pain, swelling, or redness in an arm or leg.     You have numbness or tingling in an arm or leg.     You have shortness of breath while active or at rest.     You have chest pain.     You have a rapid or irregular heartbeat.     You feel light-headed or dizzy.     You cough up blood.     You notice blood in your vomit, bowel movement, or urine.    These symptoms may represent a serious problem that is an emergency. Do not wait to see if the symptoms will go away. Get medical help right away. Call your local emergency services (911 in the [place] Do not drive yourself to the hospital.    This information is not intended to replace advice given to you by your health care provider. Make sure you discuss any questions you have with your health care provider.    Document Released: 01/06/2005 Document Revised: 09/27/2014 Document Reviewed: 05/03/2014  Elsevier Interactive Patient Education ?2016 [employer]

## 2018-01-20 NOTE — Discharge Summary (Signed)
 ED Clinical Summary                     [facility]  [address] [place], [place] [address]  [phone]          PERSON INFORMATION  Name: Doris Salazar, Doris Salazar Age:  28 Years DOB: [birth year]   Sex: Female Language: English PCP: PCP,  NONE   Marital Status: Single Phone: [phone] Med Service: MED-Medicine   MRN: [id number] Acct# [id number] Arrival: 01/20/2018 08:35:00   Visit Reason: Leg pain-swelling; Leg pain-swelling; LEFT LEG PAIN/SWELLING Acuity: 3 LOS: 000 03:25   Address:    [address] [place] [address]   Diagnosis:    DVT (deep venous thrombosis)  Medications:          New Medications  Printed Prescriptions  apixaban (Eliquis 5 mg oral tablet) PLEASE TAKE 10MG  BID FOR 7 DAYS, THEN 5MG  BID. Refills: 0.  Last Dose:____________________  HYDROcodone-acetaminophen (Norco 325 mg-5 mg oral tablet) 1 Tabs Oral (given by mouth) every 6 hours as needed moderate pain (4-7) for 3 Days. Refills: 0.  Last Dose:____________________  Medications that have not changed  Other Medications  norgestimate-ethinyl estradiol (Sprintec 0.25 mg-35 mcg oral tablet)   Last Dose:____________________      Medications Administered During Visit:                Medication Dose Route   acetaminophen 500 mg Oral   apixaban 10 mg Oral               Allergies      sulfa drugs (hives)      Major Tests and Procedures:  The following procedures and tests were performed during your ED visit.  COMMON PROCEDURES%>  COMMON PROCEDURES COMMENTS%>                PROVIDER INFORMATION               Provider Role Assigned Sampson DEVAUGHN DITCH [facility] ED Provider 01/20/2018 08:36:42    Marvis RN, Powell [facility] ED Nurse 01/20/2018 08:51:44 01/20/2018 09:00:26   Boyette, RN, Deirdre ED Nurse 01/20/2018 09:03:34        Attending Physician:  DEVAUGHN DITCH SQUIBB      Admit Doc  SKOTKO-MD,  JEREMY P     Consulting Doc       VITALS INFORMATION  Vital Sign Triage Latest   Temp Oral ORAL_1%> ORAL%>   Temp Temporal TEMPORAL_1%>  TEMPORAL%>   Temp Intravascular INTRAVASCULAR_1%> INTRAVASCULAR%>   Temp Axillary AXILLARY_1%> AXILLARY%>   Temp Rectal RECTAL_1%> RECTAL%>   02 Sat 100 % 98 %   Respiratory Rate RATE_1%> RATE%>   Peripheral Pulse Rate PULSE RATE_1%>93 bpm PULSE RATE%>   Apical Heart Rate HEART RATE_1%> HEART RATE%>   Blood Pressure BLOOD PRESSURE_1%>/ BLOOD PRESSURE_1%>94 mmHg BLOOD PRESSURE%> / BLOOD PRESSURE%>94 mmHg                 Immunizations      No Immunizations Documented This Visit          DISCHARGE INFORMATION   Discharge Disposition: H Outpt-Sent Home   Discharge Location:  Home   Discharge Date and Time:  01/20/2018 12:00:00   ED Checkout Date and Time:  01/20/2018 12:00:00     DEPART REASON INCOMPLETE INFORMATION               Depart Action Incomplete Reason  Interactive View/I&O Recently assessed               Problems      Active           No Chronic Problems              Smoking Status      Never smoker         PATIENT EDUCATION INFORMATION  Instructions:     Deep Vein Thrombosis     Follow up:                   With: Address: When:   Follow up with primary care provider  Within 2 to 4 days   Comments:   It is important for you to follow-up with your primary physician. Please return to the emergency department should your symptoms persist or worsen, develop fever, uncontrollable pain, chest pain, difficulty breathing, and or develop any other concerns.              ED PROVIDER DOCUMENTATION     Patient:   Doris Salazar, Doris Salazar             MRN: [id number]            FIN: [id number]               Age:   28 years     Sex:  Female     DOB:  November 15, 1990   Associated Diagnoses:   DVT (deep venous thrombosis)   Author:   DEVAUGHN VENETIA SQUIBB      Basic Information   Time seen: Provider Seen (ST)   ED Provider/Time:    DEVAUGHN VENETIA P / 01/20/2018 08:36  .   Additional information: Chief Complaint from Nursing Triage Note   Chief Complaint   No qualifying data available.SABRA      History of Present Illness   28 year old female  presents to the emergency department with left leg pain for the past 2 days.  Patient denies any known injury.  No chest pain.  No shortness of breath.  Patient reports that she had similar symptoms 1 month ago but resolved after she stopped taking her birth control.  No fever.  Patient reports pain worse with movement.  Patient locates her pain to the left upper thigh..        Review of Systems             Additional review of systems information: All systems reviewed as documented in chart.      Health Status   Allergies:    Allergic Reactions (All)  Mild  Sulfa drugs- Hives..   Medications:  (Selected)   Inpatient Medications  Ordered  Tylenol: 500 mg, 1 tabs, Oral, Once.      Past Medical/ Family/ Social History   Problem list:    Active Problems (1)  No Chronic Problems   .      Physical Examination   General:  Alert, no acute distress.    Skin:  Warm, dry.    Head:  Normocephalic, atraumatic.    Neck:  Supple.   Cardiovascular:  Regular rate and rhythm, Normal peripheral perfusion.    Respiratory:  Lungs are clear to auscultation, respirations are non-labored, breath sounds are equal, Symmetrical chest wall expansion.    Gastrointestinal:  Soft, Nontender, Non distended.    Musculoskeletal:  LLE: distal motor / sensory intact; +2 DP pulse, slight swelling to the left lower leg; no  erythema to the LLE.   Psychiatric:  Cooperative, appropriate mood & affect.       Medical Decision Making   Differential Diagnosis:  Deep vein thrombosis.   Orders  Launch Order Profile (Selected)   Inpatient Orders  InProcess (Procedure Completed)  VL DVT Study LE Unilateral:   Completed  .Pregnancy Urine POC:   .Urinalysis POC:   CBC (Complete Blood Count):   CMP:   Communication to Nursing:   Communication to Nursing:   ED Assessment Adult:   ED Secondary Triage:   ED Triage Adult:   Eliquis: 10 mg, 2 tabs, Oral, Once  Notify Provider:   Notify Provider:   Notify Provider:   POC-Urine Dipstick collect:   POC-Urine Pregnancy Test  collect:   PT with INR:   Tylenol: 500 mg, 1 tabs, Oral, Once  Discontinued  BMP:   Prescriptions  Prescribed  Eliquis 5 mg oral tablet: See Instructions, PLEASE TAKE 10MG  BID FOR 7 DAYS, THEN 5MG  BID, 74 tabs, 0 Refill(s)  .   Results review:  Lab results : Lab View   01/20/2018 11:26 EST Estimated Creatinine Clearance 162.29 mL/min   01/20/2018 11:02 EST WBC 9.4 x10e3/mcL    RBC 4.38 x10e6/mcL    Hgb 11.6 g/dL    HCT 65.1 %    MCV 20.5 fL  LOW    MCH 26.4 pg  LOW    MCHC 33.3 g/dL    RDW 86.4 %    Platelet 189 x10e3/mcL    MPV 8.4 fL    PT 14.1 seconds    INR 1.1  LOW    Sodium Lvl 134 mmol/L  LOW    Potassium Lvl 3.8 mmol/L    Chloride 99 mmol/L    CO2 23 mmol/L    Glucose Random 89 mg/dL    BUN 7 mg/dL    Creatinine Lvl 0.5 mg/dL    AGAP 12 mmol/L    Osmolality Calc 266 mOsm/kg  LOW    Calcium Lvl 9.1 mg/dL    Protein Total 8.2 g/dL    Albumin Lvl 3.9 g/dL    Globulin Calc 4.0 g/dL    AG Ratio Calc 8.99 mmol/L    Alk Phos 66 unit/L    AST 11 unit/L    ALT 9 unit/L    eGFR AA 153 mL/min/1.31m    eGFR Non-AA 132 mL/min/1.2m    Bili Total 0.33 mg/dL   08/27/7977 1:40 EST Preg U POC NEGATIVE   01/20/2018 8:57 EST Appear U POC CLEAR    Color U POC YELLOW    Bili U POC Negative    Blood U POC Trace    Glucose U POC NEGATIVE mg/dL    Ketones U POC Negative mg/dL    Leuk Est U POC Negative    Nitrite U POC Negative    pH U POC 6.5    Protein U POC Negative    Spec Grav U POC 1.025    Urobilin U POC 0.2 EU/dL     .   Notes:  US  DVT LLE: +DVT in the left common femoral vein.      Reexamination/ Reevaluation   Time: 01/20/2018 10:27:00 .   Notes: Patient in no acute distress with easy work of breathing.  Patient again denies chest pain.  Patient again denies difficulty breathing.  I informed patient of positive blood clot in the left leg.  I recommended close follow-up with her primary care physician..      Impression and  Plan   Diagnosis   DVT (deep venous thrombosis) (ICD10-CM I82.409, Discharge, Medical)   Plan   Condition:  Stable.    Disposition: Discharged: Time  01/20/2018 11:28:00, to home.    Prescriptions: Launch prescriptions   Pharmacy:  Eliquis 5 mg oral tablet (Prescribe): See Instructions, PLEASE TAKE 10MG  BID FOR 7 DAYS, THEN 5MG  BID, 74 tabs, 0 Refill(s)  , Launch prescriptions   Pharmacy:  Norco 325 mg-5 mg oral tablet (Prescribe): 1 tabs, Oral, q6hr, for 3 days, PRN: moderate pain (4-7), 12 tabs, 0 Refill(s)  .    Patient was given the following educational materials: Deep Vein Thrombosis.    Follow up with: Follow up with primary care provider Within 2 to 4 days It is important for you to follow-up with your primary physician.  Please return to the emergency department should your symptoms persist or worsen, develop fever, uncontrollable pain, chest pain, difficulty breathing, and or develop any other concerns..    Counseled: Patient, Regarding diagnostic results, Regarding treatment plan, Patient indicated understanding of instructions.

## 2018-01-20 NOTE — ED Notes (Signed)
 ED Patient Summary       ;      [facility] Emergency Department  [address] [place], [place] [address]  [phone]  Discharge Instructions (Patient)  _______________________________________    Name: Doris Salazar, Doris Salazar  DOB: 1990-12-03 MRN: [id number] FIN: [phone]  Reason For Visit: Leg pain-swelling; Leg pain-swelling; LEFT LEG PAIN/SWELLING  Final Diagnosis: DVT (deep venous thrombosis)    Visit Date: 01/20/2018 08:35:00  Address: [address] [place] [address]  Phone: [phone]    Primary Care Provider:  Name: PCP,  NONE  Phone:      Emergency Department Providers:         Primary Physician:   DEVAUGHN VENETIA SQUIBB        [facility] would like to thank you for allowing us  to assist you with your healthcare needs. The following includes patient education materials and information regarding your injury/illness.    Follow-up Instructions: You were treated today on an emergency basis, it may be wise to contact your primary care provider to notify them of your visit today. You may have been referred to your regular doctor or a specialist, please follow up as instructed. If your condition worsens or you can't get in to see the doctor, contact the Emergency Department.              With: Address: When:   Follow up with primary care provider  Within 2 to 4 days   Comments:   It is important for you to follow-up with your primary physician. Please return to the emergency department should your symptoms persist or worsen, develop fever, uncontrollable pain, chest pain, difficulty breathing, and or develop any other concerns.             Patient Education Materials:  Deep Vein Thrombosis     Deep Vein Thrombosis    A deep vein thrombosis (DVT) is a blood clot (thrombus) that usually occurs in a deep, larger vein of the lower leg or the pelvis, or in an upper extremity such as the arm. These are dangerous and can lead to serious and even life-threatening complications if the clot  travels to the lungs.    A DVT can damage the valves in your leg veins so that instead of flowing upward, the blood pools in the lower leg. This is called post-thrombotic syndrome, and it can result in pain, swelling, discoloration, and sores on the leg.      CAUSES    A DVT is caused by the formation of a blood clot in your leg, pelvis, or arm. Usually, several things contribute to the formation of blood clots. A clot may develop when:     Your blood flow slows down.     Your vein becomes damaged in some way.     You have a condition that makes your blood clot more easily.    RISK FACTORS    A DVT is more likely to develop in:     People who are older, especially over 32 years of age.     People who are overweight (obese).     People who sit or lie still for a long time, such as during long-distance travel (over 4 hours), bed rest, hospitalization, or during recovery from certain medical conditions like a stroke.     People who do not engage in much physical activity (sedentary lifestyle).     People who have chronic breathing disorders.  People who have a personal or family history of blood clots or blood clotting disease.     People who have peripheral vascular disease (PVD), diabetes, or some types of cancer.     People who have heart disease, especially if the person had a recent heart attack or has congestive heart failure.     People who have neurological diseases that affect the legs (leg paresis).     People who have had a traumatic injury, such as breaking a hip or leg.     People who have recently had major or lengthy surgery, especially on the hip, knee, or abdomen.     People who have had a central line placed inside a large vein.     People who take medicines that contain the hormone estrogen. These include birth control pills and hormone replacement therapy.     Pregnancy or during childbirth or the postpartum period.       Long plane flights (over 8 hours).    SIGNS AND SYMPTOMS    Symptoms of a  DVT can include:      Swelling of your leg or arm, especially if one side is much worse.     Warmth and redness of your leg or arm, especially if one side is much worse.     Pain in your arm or leg. If the clot is in your leg, symptoms may be more noticeable or worse when you stand or walk.     A feeling of pins and needles, if the clot is in the arm.    The symptoms of a DVT that has traveled to the lungs (pulmonary embolism, PE) usually start suddenly and include:     Shortness of breath while active or at rest.     Coughing or coughing up blood or blood-tinged mucus.     Chest pain that is often worse with deep breaths.     Rapid or irregular heartbeat.     Feeling light-headed or dizzy.     Fainting.     Feeling anxious.     Sweating.    There may also be pain and swelling in a leg if that is where the blood clot started.    These symptoms may represent a serious problem that is an emergency. Do not wait to see if the symptoms will go away. Get medical help right away. Call your local emergency services (911 in the [place] Do not drive yourself to the hospital.    DIAGNOSIS    Your health care provider will take a medical history and perform a physical exam. You may also have other tests, including:     Blood tests to assess the clotting properties of your blood.     Imaging tests, such as CT, ultrasound, MRI, X-ray, and other tests to see if you have clots anywhere in your body.    TREATMENT    After a DVT is identified, it can be treated. The type of treatment that you receive depends on many factors, such as the cause of your DVT, your risk for bleeding or developing more clots, and other medical conditions that you have. Sometimes, a combination of treatments is necessary. Treatment options may be combined and include:     Monitoring the blood clot with ultrasound.     Taking medicines by mouth, such as newer blood thinners (anticoagulants), thrombolytics, or warfarin.     Taking anticoagulant medicine by  injection or through an IV tube.  Wearing compression stockings or using different types of?devices.     Surgery (rare) to remove the blood clot or to place a filter in your abdomen to stop the blood clot from traveling to your lungs.    Treatments for a DVT are often divided into immediate treatment and long-term treatment (up to 3 months after DVT). You can work with your health care provider to choose the treatment program that is best for you.    HOME CARE INSTRUCTIONS    If you are taking a newer oral anticoagulant:     Take the medicine every single day at the same time each day.     Understand what foods and drugs interact with this medicine.     Understand that there are no regular blood tests required when using this medicine.     Understand the side effects of this medicine, including excessive bruising or bleeding. Ask your health care provider or pharmacist about other possible side effects.    If you are taking warfarin:     Understand how to take warfarin and know which foods can affect how warfarin works in Public relations account executive.      Understand that it is dangerous to take too much or too little warfarin. Too much warfarin increases the risk of bleeding. Too little warfarin continues to allow the risk for blood clots.     Follow your PT and INR blood testing schedule. The PT and INR results allow your health care provider to adjust your dose of warfarin. It is very important that you have your PT and INR tested as often as told by your health care provider.     Avoid major changes in your diet, or tell your health care provider before you change your diet. Arrange a visit with a registered dietitian to answer your questions. Many foods, especially foods that are high in vitamin K, can interfere with warfarin and affect the PT and INR results. Eat a consistent amount of foods that are high in vitamin K, such as:    ? Spinach, kale, broccoli, cabbage, collard greens, turnip greens, Brussels sprouts, peas,  cauliflower, seaweed, and parsley.    ? Beef liver and pork liver.    ? Green tea.    ? Soybean oil.     Tell your health care provider about any and all medicines, vitamins, and supplements that you take, including aspirin and other over-the-counter anti-inflammatory medicines. Be especially cautious with aspirin and anti-inflammatory medicines. Do not take those before you ask your health care provider if it is safe to do so. This is important because many medicines can interfere with warfarin and affect the PT and INR results.     Do not start or stop taking any over-the-counter or prescription medicine unless your health care provider or pharmacist tells you to do so.    If you take warfarin, you will also need to do these things:     Hold pressure over cuts for longer than usual.      Tell your dentist and other health care providers that you are taking warfarin before you have any procedures in which bleeding may occur.     Avoid alcohol or drink very small amounts. Tell your health care provider if you change your alcohol intake.     Do not use tobacco products, including cigarettes, chewing tobacco, and e-cigarettes. If you need help quitting, ask your health care provider.     Avoid contact sports.  General Instructions     Take over-the-counter and prescription medicines only as told by your health care provider. Anticoagulant medicines can have side effects, including easy bruising and difficulty stopping bleeding. If you are prescribed an anticoagulant, you will also need to do these things:    ? Hold pressure over cuts for longer than usual.    ? Tell your dentist and other health care providers that you are taking anticoagulants before you have any procedures in which bleeding may occur.     ? Avoid contact sports.      Wear a medical alert bracelet or carry a medical alert card that says you have had a PE.     Ask your health care provider how soon you can go back to your normal activities. Stay  active to prevent new blood clots from forming.     Make sure to exercise while traveling or when you have been sitting or standing for a long period of time. It is very important to exercise. Exercise your legs by walking or by tightening and relaxing your leg muscles often. Take frequent walks.     Wear compression stockings as told by your health care provider to help prevent more blood clots from forming.     Do not use tobacco products, including cigarettes, chewing tobacco, and e-cigarettes. If you need help quitting, ask your health care provider.     Keep all follow-up appointments with your health care provider. This is important.    PREVENTION    Take these actions to decrease your risk of developing another DVT:     Exercise regularly. For at least 30 minutes every day, engage in:    ? Activity that involves moving your arms and legs.    ? Activity that encourages good blood flow through your body by increasing your heart rate.     Exercise your arms and legs every hour during long-distance travel (over 4 hours). Drink plenty of water and avoid drinking alcohol while traveling.     Avoid sitting or lying in bed for long periods of time without moving your legs.     Maintain a weight that is appropriate for your height. Ask your health care provider what weight is healthy for you.     If you are a woman who is over 64 years of age, avoid unnecessary use of medicines that contain estrogen. These include birth control pills.     Do not smoke, especially if you take estrogen medicines. If you need help quitting, ask your health care provider.    If you are hospitalized, prevention measures may include:     Early walking after surgery, as soon as your health care provider says that it is safe.     Receiving anticoagulants to prevent blood clots.?If you cannot take anticoagulants, other options may be available, such as wearing compression stockings or using different types of devices.    SEEK IMMEDIATE MEDICAL  CARE IF:     You have new or increased pain, swelling, or redness in an arm or leg.     You have numbness or tingling in an arm or leg.     You have shortness of breath while active or at rest.     You have chest pain.     You have a rapid or irregular heartbeat.     You feel light-headed or dizzy.     You cough up blood.     You notice blood in  your vomit, bowel movement, or urine.    These symptoms may represent a serious problem that is an emergency. Do not wait to see if the symptoms will go away. Get medical help right away. Call your local emergency services (911 in the [place] Do not drive yourself to the hospital.    This information is not intended to replace advice given to you by your health care provider. Make sure you discuss any questions you have with your health care provider.    Document Released: 01/06/2005 Document Revised: 09/27/2014 Document Reviewed: 05/03/2014  Elsevier Interactive Patient Education ?2016 [employer]vier Inc.        Allergy Info: sulfa drugs    Medication Information:  [facility] ED Physicians provided you with a complete list of medications post discharge, if you have been instructed to stop taking a medication please ensure you also follow up with this information to your Primary Care Physician. Unless otherwise noted, patient will continue to take medications as prescribed prior to the Emergency Room visit. Any specific questions regarding your chronic medications and dosages should be discussed with your physician(s) and pharmacist.          New Medications  Printed Prescriptions  apixaban (Eliquis 5 mg oral tablet) PLEASE TAKE 10MG  BID FOR 7 DAYS, THEN 5MG  BID. Refills: 0.  Last Dose:____________________  HYDROcodone-acetaminophen (Norco 325 mg-5 mg oral tablet) 1 Tabs Oral (given by mouth) every 6 hours as needed moderate pain (4-7) for 3 Days. Refills: 0.  Last Dose:____________________  Medications that have not changed  Other Medications  norgestimate-ethinyl  estradiol (Sprintec 0.25 mg-35 mcg oral tablet)   Last Dose:____________________      Medications Administered During Visit:              Medication Dose Route   acetaminophen 500 mg Oral   apixaban 10 mg Oral         Major Tests and Procedures:  The following procedures and tests were performed during your ED visit.  PROCEDURES%>  PROCEDURES COMMENTS%>          ---------------------------------------------------------------------------------------------------------------------  [facility] allows you to manage your health, view your test results, and retrieve your discharge documents from your hospital stay securely and conveniently from your computer.    To begin the enrollment process, visit [email or web address] Click on "Sign up now" under [facility].      Comment:

## 2018-01-20 NOTE — ED Notes (Signed)
ED Triage Note       ED Secondary Triage Entered On:  01/20/2018 8:50 EST    Performed On:  01/20/2018 8:49 EST by Delane Ginger, RN, Odelia Gage               General Information   Barriers to Learning :   None evident   Languages :   English   ED Home Meds Section :   Document assessment   [facility] ED Fall Risk Section :   Document assessment   Infectious Disease Documentation :   Document assessment   ED Advance Directives Section :   Document assessment   Delane Ginger RN, Odelia Gage - 01/20/2018 8:49 EST   (As Of: 01/20/2018 08:50:33 EST)   Problems(Active)    No Chronic Problems (Cerner  :NKP )  Name of Problem:   No Chronic Problems ; Recorder:   WILL, RN, SHAYE P; Code:   NKP ; Last Updated:   10/11/2016 19:46 EDT ; Life Cycle Date:   10/11/2016 ; Life Cycle Status:   Active ; Vocabulary:   Cerner          Diagnoses(Active)    Leg pain-swelling  Date:   01/20/2018 ; Diagnosis Type:   Reason For Visit ; Confirmation:   Confirmed ; Clinical Dx:   Leg pain-swelling ; Classification:   Medical ; Clinical Service:   Emergency medicine ; Code:   PNED ; Probability:   0 ; Diagnosis Code:   [id number]      Leg pain-swelling  Date:   01/20/2018 ; Diagnosis Type:   Reason For Visit ; Confirmation:   Complaint of ; Clinical Dx:   Leg pain-swelling ; Classification:   Medical ; Clinical Service:   Non-Specified ; Code:   PNED ; Probability:   0 ; Diagnosis Code:   [id number]             -    Procedure History   (As Of: 01/20/2018 08:50:33 EST)     Anesthesia Minutes:   0 ; Procedure Name:   D&C - Dilatation and curettage ; Procedure Minutes:   0 ; Last Reviewed Dt/Tm:   01/20/2018 08:50:30 EST            Anesthesia Minutes:   0 ; Procedure Name:   Cesarean delivery ; Procedure Minutes:   0 ; Last Reviewed Dt/Tm:   01/20/2018 08:50:20 EST            [facility] Fall Risk Assessment Tool   Hx of falling last 3 months ED Fall :   No   Patient confused or disoriented ED Fall :   No   Patient intoxicated or sedated  ED Fall :   No   Patient impaired gait ED Fall :   No   Use a mobility assistance device ED Fall :   No   Patient altered elimination ED Fall :   No   [facility] ED Fall Score :   0    Delane Ginger RN, Odelia Gage - 01/20/2018 8:49 EST   ED Advance Directive   Advance Directive :   No   Delane Ginger, RN, Heather K - 01/20/2018 8:49 EST   ID Risk Screen Symptoms   Recent Travel History :   No recent travel   TB Symptom Screen :   No symptoms   C. diff Symptom/History ID :   Neither of the above   Delane Ginger, RN, Herbert Seta K - 01/20/2018 8:49 EST   Med Hx  Medication List   (As Of: 01/20/2018 08:50:33 EST)   Normal Order    acetaminophen 500 mg Tab  :   acetaminophen 500 mg Tab ; Status:   Ordered ; Ordered As Mnemonic:   Tylenol ; Simple Display Line:   500 mg, 1 tabs, Oral, Once ; Ordering Provider:   Hiram Gash; Catalog Code:   acetaminophen ; Order Dt/Tm:   01/20/2018 08:46:06 EST            Home Meds    norgestimate-ethinyl estradiol  :   norgestimate-ethinyl estradiol ; Status:   Documented ; Ordered As Mnemonic:   Sprintec 0.25 mg-35 mcg oral tablet ; Simple Display Line:   0 Refill(s) ; Catalog Code:   norgestimate-ethinyl estradiol ; Order Dt/Tm:   01/20/2018 08:50:04 EST

## 2018-01-20 NOTE — ED Notes (Signed)
ED Triage Note       ED Triage Adult Entered On:  01/20/2018 8:49 EST    Performed On:  01/20/2018 8:43 EST by Delane Ginger, RN, Odelia Gage               Triage   Chief Complaint :   atraumatic left leg pain since Monday, denies cp, sob, ambulated into lobby, reports she stopped taking Yaz last month when experienced left leg pain and pain went away, just started back on Yaz 12/24 and pain started   Numeric Rating Pain Scale :   7   [place] Mode of Arrival :   Private vehicle   Temperature Oral :   36.8 degC(Converted to: 98.2 degF)    Heart Rate Monitored :   90 bpm   Respiratory Rate :   14 br/min   Systolic Blood Pressure :   122 mmHg   Diastolic Blood Pressure :   94 mmHg (HI)    SpO2 :   100 %   Oxygen Therapy :   Room air   Patient presentation :   None of the above   Chief Complaint or Presentation suggest infection :   No   Dosing Weight Obtained By :   Patient stated   Weight Dosing :   83 kg(Converted to: 183 lb 0 oz)    Height :   154 cm(Converted to: 5 ft 1 in)    Body Mass Index Dosing :   35 kg/m2   ED General Section :   Document assessment   Pregnancy Status :   Patient denies   Darl Pikes - 01/20/2018 8:43 EST   Delane Ginger, RN, Heather K - 01/20/2018 8:43 EST   DCP GENERIC CODE   Tracking Group :   ED [facility] Tracking Group   Darl Pikes - 01/20/2018 8:43 EST   Tracking Acuity :   3   Delane Ginger RN, Heather K - 01/20/2018 8:51 EST     ED Allergies Section :   Document assessment   ED Reason for Visit Section :   Document assessment   Delane Ginger RN, Odelia Gage - 01/20/2018 8:43 EST   Allergies   (As Of: 01/20/2018 08:49:15 EST)   Allergies (Active)   sulfa drugs  Estimated Onset Date:   Unspecified ; Reactions:   hives ; Created By:   WILL, RN, SHAYE P; Reaction Status:   Active ; Category:   Drug ; Substance:   sulfa drugs ; Type:   Allergy ; Severity:   Mild ; Updated By:   Stann Mainland RN, Dante Gang; Reviewed Date:   01/20/2018 8:46 EST        Psycho-Social   Last 3 mo, thoughts killing self/others :   Patient denies   Darl Pikes - 01/20/2018 8:43 EST   ED Reason for Visit   (As Of: 01/20/2018 08:49:15 EST)   Problems(Active)    No Chronic Problems (Cerner  :NKP )  Name of Problem:   No Chronic Problems ; Recorder:   WILL, RN, SHAYE P; Code:   NKP ; Last Updated:   10/11/2016 19:46 EDT ; Life Cycle Date:   10/11/2016 ; Life Cycle Status:   Active ; Vocabulary:   Cerner          Diagnoses(Active)    Leg pain-swelling  Date:   01/20/2018 ; Diagnosis Type:   Reason For Visit ; Confirmation:   Confirmed ; Clinical Dx:  Leg pain-swelling ; Classification:   Medical ; Clinical Service:   Emergency medicine ; Code:   PNED ; Probability:   0 ; Diagnosis Code:   [id number]      Leg pain-swelling  Date:   01/20/2018 ; Diagnosis Type:   Reason For Visit ; Confirmation:   Complaint of ; Clinical Dx:   Leg pain-swelling ; Classification:   Medical ; Clinical Service:   Non-Specified ; Code:   PNED ; Probability:   0 ; Diagnosis Code:   [id number] EE

## 2018-01-20 NOTE — ED Provider Notes (Signed)
Leg pain-swelling        Patient:   Doris Salazar, Doris Salazar             MRN: [id number]            FIN: [id number]               Age:   28 years     Sex:  Female     DOB:  1990/12/05   Associated Diagnoses:   DVT (deep venous thrombosis)   Author:   Antoine Primas      Basic Information   Time seen: Provider Seen (ST)   ED Provider/Time:    Celene Squibb P / 01/20/2018 08:36  .   Additional information: Chief Complaint from Nursing Triage Note   Chief Complaint   No qualifying data available.Marland Kitchen      History of Present Illness   28 year old female presents to the emergency department with left leg pain for the past 2 days.  Patient denies any known injury.  No chest pain.  No shortness of breath.  Patient reports that she had similar symptoms 1 month ago but resolved after she stopped taking her birth control.  No fever.  Patient reports pain worse with movement.  Patient locates her pain to the left upper thigh..        Review of Systems             Additional review of systems information: All systems reviewed as documented in chart.      Health Status   Allergies:    Allergic Reactions (All)  Mild  Sulfa drugs- Hives..   Medications:  (Selected)   Inpatient Medications  Ordered  Tylenol: 500 mg, 1 tabs, Oral, Once.      Past Medical/ Family/ Social History   Problem list:    Active Problems (1)  No Chronic Problems   .      Physical Examination   General:  Alert, no acute distress.    Skin:  Warm, dry.    Head:  Normocephalic, atraumatic.    Neck:  Supple.   Cardiovascular:  Regular rate and rhythm, Normal peripheral perfusion.    Respiratory:  Lungs are clear to auscultation, respirations are non-labored, breath sounds are equal, Symmetrical chest wall expansion.    Gastrointestinal:  Soft, Nontender, Non distended.    Musculoskeletal:  LLE: distal motor / sensory intact; +2 DP pulse, slight swelling to the left lower leg; no erythema to the LLE.   Psychiatric:  Cooperative, appropriate mood & affect.       Medical  Decision Making   Differential Diagnosis:  Deep vein thrombosis.   Orders  Launch Order Profile (Selected)   Inpatient Orders  InProcess (Procedure Completed)  VL DVT Study LE Unilateral:   Completed  .Pregnancy Urine POC:   .Urinalysis POC:   CBC (Complete Blood Count):   CMP:   Communication to Nursing:   Communication to Nursing:   ED Assessment Adult:   ED Secondary Triage:   ED Triage Adult:   Eliquis: 10 mg, 2 tabs, Oral, Once  Notify Provider:   Notify Provider:   Notify Provider:   POC-Urine Dipstick collect:   POC-Urine Pregnancy Test collect:   PT with INR:   Tylenol: 500 mg, 1 tabs, Oral, Once  Discontinued  BMP:   Prescriptions  Prescribed  Eliquis 5 mg oral tablet: See Instructions, PLEASE TAKE 10MG BID FOR 7 DAYS, THEN 5MG BID, 74 tabs, 0 Refill(s)  .  Results review:  Lab results : Lab View   01/20/2018 11:26 EST Estimated Creatinine Clearance 162.29 mL/min   01/20/2018 11:02 EST WBC 9.4 x10e3/mcL    RBC 4.38 x10e6/mcL    Hgb 11.6 g/dL    HCT 34.8 %    MCV 79.4 fL  LOW    MCH 26.4 pg  LOW    MCHC 33.3 g/dL    RDW 13.5 %    Platelet 189 x10e3/mcL    MPV 8.4 fL    PT 14.1 seconds    INR 1.1  LOW    Sodium Lvl 134 mmol/L  LOW    Potassium Lvl 3.8 mmol/L    Chloride 99 mmol/L    CO2 23 mmol/L    Glucose Random 89 mg/dL    BUN 7 mg/dL    Creatinine Lvl 0.5 mg/dL    AGAP 12 mmol/L    Osmolality Calc 266 mOsm/kg  LOW    Calcium Lvl 9.1 mg/dL    Protein Total 8.2 g/dL    Albumin Lvl 3.9 g/dL    Globulin Calc 4.0 g/dL    AG Ratio Calc 1.00 mmol/L    Alk Phos 66 unit/L    AST 11 unit/L    ALT 9 unit/L    eGFR AA 153 mL/min/1.16m???    eGFR Non-AA 132 mL/min/1.7107m??    Bili Total 0.33 mg/dL   01/20/2018 8:59 EST Preg U POC NEGATIVE   01/20/2018 8:57 EST Appear U POC CLEAR    Color U POC YELLOW    Bili U POC Negative    Blood U POC Trace    Glucose U POC NEGATIVE mg/dL    Ketones U POC Negative mg/dL    Leuk Est U POC Negative    Nitrite U POC Negative    pH U POC 6.5    Protein U POC Negative    Spec Grav U POC 1.025     Urobilin U POC 0.2 EU/dL     .   Notes:  [place] LLE: +DVT in the left common femoral vein.      Reexamination/ Reevaluation   Time: 01/20/2018 10:27:00 .   Notes: Patient in no acute distress with easy work of breathing.  Patient again denies chest pain.  Patient again denies difficulty breathing.  I informed patient of positive blood clot in the left leg.  I recommended close follow-up with her primary care physician..      Impression and Plan   Diagnosis   DVT (deep venous thrombosis) (ICD10-CM I82.409, Discharge, Medical)   Plan   Condition: Stable.    Disposition: Discharged: Time  01/20/2018 11:28:00, to home.    Prescriptions: Launch prescriptions   Pharmacy:  Eliquis 5 mg oral tablet (Prescribe): See Instructions, PLEASE TAKE 10MG BID FOR 7 DAYS, THEN 5MG BID, 74 tabs, 0 Refill(s)  , Launch prescriptions   Pharmacy:  Norco 325 mg-5 mg oral tablet (Prescribe): 1 tabs, Oral, q6hr, for 3 days, PRN: moderate pain (4-7), 12 tabs, 0 Refill(s)  .    Patient was given the following educational materials: Deep Vein Thrombosis.    Follow up with: Follow up with primary care provider Within 2 to 4 days It is important for you to follow-up with your primary physician.  Please return to the emergency department should your symptoms persist or worsen, develop fever, uncontrollable pain, chest pain, difficulty breathing, and or develop any other concerns..    Counseled: Patient, Regarding diagnostic results, Regarding treatment plan, Patient indicated  understanding of instructions.    Librarian, academic Signed on 01/20/2018 11:28 AM EST   ________________________________________________   Celene Squibb P      Electronically Signed on 01/20/2018 12:00 PM EST   ________________________________________________   Celene Squibb P            Modified by: Celene Squibb P on 01/20/2018 09:04 AM EST      Modified by: Osa Craver,  Maudy Yonan P on 01/20/2018 10:26 AM EST      Modified by: Osa Craver,  Aileen Amore P on  01/20/2018 10:28 AM EST      Modified by: Osa Craver,  Charina Fons P on 01/20/2018 11:01 AM EST      Modified by: Osa Craver,  Ellery Tash P on 01/20/2018 11:28 AM EST      Modified by: Osa Craver,  Juanya Villavicencio P on 01/20/2018 12:00 PM EST

## 2019-01-12 LAB — WET PREP, GENITAL
CLUE CELLS WP, TEST92: ABSENT
RBC, Wet Prep: ABSENT
Trich, Wet Prep: ABSENT
WBC, Wet Prep: ABSENT

## 2019-01-12 LAB — POC URINALYSIS, CHEMISTRY
Bilirubin, Urine, POC: NEGATIVE
Blood, UA POC: NEGATIVE
Glucose, UA POC: NEGATIVE mg/dL
Ketones, Urine, POC: NEGATIVE mg/dL
Leukocytes, UA: NEGATIVE
Nitrate, UA POC: NEGATIVE
Protein, Urine, POC: NEGATIVE
Specific Gravity, Urine, POC: 1.02 (ref 1.003–1.035)
UROBILIN U POC: 0.2 EU/dL
pH, Urine, POC: 7 (ref 4.5–8.0)

## 2019-01-12 LAB — SMEAR FUNGUS: FINAL REPORT: NONE SEEN

## 2019-01-12 LAB — POC PREGNANCY UR-QUAL: Preg Test, Ur: NEGATIVE

## 2019-01-12 NOTE — ED Notes (Signed)
ED Triage Note       ED Triage Adult Entered On:  01/12/2019 17:44 EST    Performed On:  01/12/2019 17:40 EST by Inceoglu, RN, Buse               Triage   Chief Complaint :   pr states she had intercourse on Saturday and her partner had Trichomoniasis, and told her today to check for exposure.denies any urinary symptoms.   Numeric Rating Pain Scale :   0 = No pain   [place] Mode of Arrival :   Private vehicle   Infectious Disease Documentation :   Document assessment   Temperature Oral :   37 degC(Converted to: 98.6 degF)    Heart Rate Monitored :   79 bpm   Respiratory Rate :   18 br/min   Systolic Blood Pressure :   628 mmHg   Diastolic Blood Pressure :   82 mmHg   SpO2 :   99 %   Oxygen Therapy :   Room air   Patient presentation :   None of the above   Chief Complaint or Presentation suggest infection :   No   Dosing Weight Obtained By :   Patient stated   Weight Dosing :   82 kg(Converted to: 180 lb 12 oz)    Height :   152.4 cm(Converted to: 5 ft 0 in)    Body Mass Index Dosing :   35 kg/m2   Inceoglu, RN, Buse - 01/12/2019 17:40 EST   DCP GENERIC CODE   Tracking Acuity :   4   Tracking Group :   ED [employer]   Inceoglu, RN, Buse - 01/12/2019 17:40 EST   ED General Section :   Document assessment   Pregnancy Status :   Patient denies   ED Allergies Section :   Document assessment   ED Reason for Visit Section :   Document assessment   ED Quick Assessment :   Patient appears awake, alert, oriented to baseline. Skin warm and dry. Moves all extremities. Respiration even and unlabored. Appears in no apparent distress.   Inceoglu, RN, Buse - 01/12/2019 17:40 EST   ID Risk Screen Symptoms   Recent Travel History :   No recent travel   Close Contact with COVID-19 ID :   No   Last 14 days COVID-19 ID :   Yes - Not Detected (negative)   TB Symptom Screen :   No symptoms   C. diff Symptom/History ID :   Neither of the above   Inceoglu, RN, Buse - 01/12/2019 17:40 EST   Allergies   (As Of: 01/12/2019  17:44:33 EST)   Allergies (Active)   sulfa drugs  Estimated Onset Date:   Unspecified ; Reactions:   hives ; Created By:   WILL, RN, SHAYE P; Reaction Status:   Active ; Category:   Drug ; Substance:   sulfa drugs ; Type:   Allergy ; Severity:   Mild ; Updated By:   WILL, RN, Hildred Priest; Reviewed Date:   01/12/2019 17:41 EST        Psycho-Social   Last 3 mo, thoughts killing self/others :   Patient denies   ED Behavioral Activity Rating Scale :   4 - Quiet and awake (normal level of activity)   Inceoglu, RN, Buse - 01/12/2019 17:40 EST   ED Reason for Visit   (As Of: 01/12/2019 17:44:33 EST)   Problems(Active)  No Chronic Problems (Cerner  :NKP )  Name of Problem:   No Chronic Problems ; Recorder:   WILL, RN, SHAYE P; Code:   NKP ; Last Updated:   10/11/2016 19:46 EDT ; Life Cycle Date:   10/11/2016 ; Life Cycle Status:   Active ; Vocabulary:   Cerner          Diagnoses(Active)    STD exposure  Date:   01/12/2019 ; Diagnosis Type:   Reason For Visit ; Confirmation:   Complaint of ; Clinical Dx:   STD exposure ; Classification:   Medical ; Clinical Service:   Emergency medicine ; Code:   PNED ; Probability:   0 ; Diagnosis Code:   [id number]

## 2019-01-12 NOTE — Discharge Summary (Signed)
ED Clinical Summary                     [facility] [place] [address]  [phone]          PERSON INFORMATION  Name: Doris Salazar, Doris Salazar Age:  28 Years DOB: 1990-08-16   Sex: Female Language: English PCP: PCP,  NONE   Marital Status: Single Phone: [phone] Med Service: MED-Medicine   MRN: [id number] Acct# [id number] Arrival: 01/12/2019 17:12:00   Visit Reason: STD exposure; STD EXPOSURE Acuity: 4 LOS: 000 02:03   Address:    [place] [address]   Diagnosis:    STD exposure  Medications:    Medications Administered During Visit:                Medication Dose Route   ceftriaxone 250 mg IM   azithromycin 1000 mg Oral               Allergies      sulfa drugs (hives)      Major Tests and Procedures:  The following procedures and tests were performed during your ED visit.  COMMON PROCEDURES%>  COMMON PROCEDURES COMMENTS%>                PROVIDER INFORMATION               Provider Role Assigned Venia Carbon, RN, Buse ED Nurse 01/12/2019 17:37:26    Charlotta Newton [facility] K ED MidLevel 01/12/2019 17:38:37        Attending Physician:  Leandrew Koyanagi      Admit Doc  MAILE-MD,  Gerline Legacy     Consulting Doc       VITALS INFORMATION  Vital Sign Triage Latest   Temp Oral ORAL_1%> ORAL%>   Temp Temporal TEMPORAL_1%> TEMPORAL%>   Temp Intravascular INTRAVASCULAR_1%> INTRAVASCULAR%>   Temp Axillary AXILLARY_1%> AXILLARY%>   Temp Rectal RECTAL_1%> RECTAL%>   02 Sat 99 % 99 %   Respiratory Rate RATE_1%> RATE%>   Peripheral Pulse Rate PULSE RATE_1%>86 bpm PULSE RATE%>   Apical Heart Rate HEART RATE_1%> HEART RATE%>   Blood Pressure BLOOD PRESSURE_1%>/ BLOOD PRESSURE_1%>82 mmHg BLOOD PRESSURE%> / BLOOD PRESSURE%>82 mmHg                 Immunizations      No Immunizations Documented This Visit          DISCHARGE INFORMATION   Discharge Disposition: H Outpt-Sent Home   Discharge Location:  Home   Discharge Date and Time:   01/12/2019 19:15:53   ED Checkout Date and Time:  01/12/2019 19:15:53     DEPART REASON INCOMPLETE INFORMATION               Depart Action Incomplete Reason   Interactive View/I&O Recently assessed               Problems      Active           No Chronic Problems              Smoking Status      Never smoker         PATIENT EDUCATION INFORMATION  Instructions:     [facility] Referral List (Custom)     Follow up:  With: Address: When:   Public Health Department  Within 1 week   Comments:   Return to ED if symptoms worsen. Please refrain from sexual activity for 1 week after treatment.  Please inform all partners of infection.  If partners aren't treated and sexual activity resumes you can be reinfected.  Please wear protection in the future for all sexual encounters.              ED PROVIDER DOCUMENTATION

## 2019-01-12 NOTE — ED Notes (Signed)
ED Triage Note       ED Secondary Triage Entered On:  01/12/2019 17:45 EST    Performed On:  01/12/2019 17:44 EST by Inceoglu, RN, Buse               General Information   Barriers to Learning :   None evident   Languages :   [place]   ED Home Meds Section :   Document assessment   [place] ED Fall Risk Section :   Document assessment   ED History Section :   Document assessment   ED Advance Directives Section :   Document assessment   ED Physician Specialty List Section :   Document assessment   ED Palliative Screen :   N/A (prefilled for <65yo)   Inceoglu, RN, Buse - 01/12/2019 17:44 EST   (As Of: 01/12/2019 17:45:34 EST)   Problems(Active)    No Chronic Problems ([place]erner  :NKP )  Name of Problem:   No Chronic Problems ; Recorder:   WILL, RN, SHAYE P; Code:   NKP ; Last Updated:   10/11/2016 19:46 EDT ; Life Cycle Date:   10/11/2016 ; Life Cycle Status:   Active ; Vocabulary:   Cerner          Diagnoses(Active)    STD exposure  Date:   01/12/2019 ; Diagnosis Type:   Reason For Visit ; Confirmation:   Complaint of ; Clinical Dx:   STD exposure ; Classification:   Medical ; Clinical Service:   Emergency medicine ; Code:   PNED ; Probability:   0 ; Diagnosis Code:   [id number]             -    Procedure History   (As Of: 01/12/2019 17:45:34 EST)     Anesthesia Minutes:   0 ; Procedure Name:   D&C - Dilatation and curettage ; Procedure Minutes:   0 ; Last Reviewed Dt/Tm:   01/12/2019 17:45:32 EST            Anesthesia Minutes:   0 ; Procedure Name:   Cesarean delivery ; Procedure Minutes:   0 ; Last Reviewed Dt/Tm:   01/12/2019 17:45:32 EST            [facility] Fall Risk Assessment Tool   Hx of falling last 3 months ED Fall :   No   Patient confused or disoriented ED Fall :   No   Patient intoxicated or sedated ED Fall :   No   Patient impaired gait ED Fall :   No   Use a mobility assistance device ED Fall :   No   Patient altered elimination ED Fall :   No   [facility] ED Fall Score :   0     Inceoglu, RN, Buse - 01/12/2019 17:44 EST   ED Advance Directive   Advance Directive :   No   Inceoglu, RN, Buse - 01/12/2019 17:44 EST   Social History   Social History   (As Of: 01/12/2019 17:45:34 EST)   Tobacco:        Never smoker   (Last Updated: 10/11/2016 19:47:08 EDT by WILL, RN, SHAYE P)          Alcohol:        Denies   (Last Updated: 01/12/2019 17:45:10 EST by Junie Bame, RN, Buse)          Substance Use:        Denies   (Last Updated: 01/12/2019 17:45:14  EST by Inceoglu, RN, Buse)            Med Hx   Medication List   (As Of: 01/12/2019 17:45:34 EST)   Prescription/Discharge Order    apixaban  :   apixaban ; Status:   Completed ; Ordered As Mnemonic:   Eliquis 5 mg oral tablet ; Simple Display Line:   See Instructions, PLEASE TAKE 10MG  BID FOR 7 DAYS, THEN 5MG  BID, 74 tabs, 0 Refill(s) ; Ordering Provider:   Hiram GashSKOTKO-MD,  JEREMY P; Catalog Code:   apixaban ; Order Dt/Tm:   01/20/2018 10:59:47 EST            Home Meds    norgestimate-ethinyl estradiol  :   norgestimate-ethinyl estradiol ; Status:   Completed ; Ordered As Mnemonic:   Sprintec 0.25 mg-35 mcg oral tablet ; Simple Display Line:   0 Refill(s) ; Catalog Code:   norgestimate-ethinyl estradiol ; Order Dt/Tm:   01/20/2018 08:50:04 EST

## 2019-01-12 NOTE — ED Notes (Signed)
ED Patient Education Note     Patient Education Materials Follows:               Public Health Clinic Referral List      Family Planning, Immunizations, Sexual Transmitted Diseases, Teen Clinics, [place], HIV, TB & Smoking Cessation Assistance.     [facility]  [address] [place] [address]   [phone]  Mon-Fri, 8:30am-5pm [facility] appointment scheduling ph- [phone])     [facility]  [address] [place] [address]    ph- [phone]  Mon-Fri, 8am-4:30pm     [facility]  [address], [address] [place] [address]   [phone]  Mon-Thurs, 8am-6pm, Fri 8am-5pm     [facility]  [address] [place] [address]    [phone], 8am-5pm     [facility]  [address] [place] [address]   [phone], 9:30am-6pm, Tue-Fri, 8:30am-5pm     [facility]  [address] [place] [address]   [phone], 8:30am-6:30pm, Tue-Fri, 8am-5:30pm     [facility]  [address], [place] [address]    [phone]- Caleen Essex, 8:30am-5pm     [facility]  [address] [place], [place] [address]   [phone] and Fri, 7:30am-5:30pm, Tue-Thurs 7:30am-6pm     [facility]  [address] [address]   [phone]  Call for appointment      [facility]  [address] [place] [address]   [phone], call for appointments

## 2019-01-12 NOTE — ED Notes (Signed)
 ED Patient Summary              [facility] Emergency Department  [address] [place], [place] [address]  [phone]  Discharge Instructions (Patient)  _______________________________________     Doris Salazar  DOB:  Jun 03, 1990                   MRN: [id number]                   FIN: [id number]  Reason For Visit: STD exposure; STD EXPOSURE  Final Diagnosis: STD exposure     Visit Date: 01/12/2019 17:12:00  Address: [address] [place] [address]  Phone: [phone]     Emergency Department Providers:         Primary Physician:            [facility] would like to thank you for allowing us  to assist you with your healthcare needs. The following includes patient education materials and information regarding your injury/illness.     Follow-up Instructions:  You were seen today on an emergency basis. Please contact your primary care doctor for a follow up appointment. If you received a referral to a specialist doctor, it is important you follow-up as instructed.    It is important that you call your follow-up doctor to schedule and confirm the location of your next appointment. Your doctor may practice at multiple locations. The office location of your follow-up appointment may be different to the one written on your discharge instructions.    If you do not have a primary care doctor, please call [phone] for help in finding a Doris Salazar. [facility] Provider. For help in finding a specialist doctor, please call [phone].    The [facility] "Ask a Nurse" line in staffed by Registered Nurses and is a free service to the community. We are available Monday - Friday from 8am to 5pm to answer your questions about your health. Please call [phone].    If your condition gets worse before your follow-up with your primary care doctor or specialist, please return to the Emergency Department.        Follow Up  Appointments:  Primary Care Provider:      Name: PCP,  NONE      Phone:                  With: Address: When:   Public Health Department  Within 1 week   Comments:   Return to ED if symptoms worsen. Please refrain from sexual activity for 1 week after treatment.  Please inform all partners of infection.  If partners aren't treated and sexual activity resumes you can be reinfected.  Please wear protection in the future for all sexual encounters.              Printed Prescriptions:    Patient Education Materials:  Discharge Orders          Discharge Patient 01/12/19 18:47:00 EST         Comment:      Public Health Clinic Referral List Social worker)               Public Health Clinic Referral List      Family Planning, Immunizations, Sexual Transmitted Diseases, Teen Clinics, MAINE, HIV, TB & Smoking Cessation Assistance.     [facility]  [address] [place], [place]  [address]   ph-[phone] or [phone]  Mon-Fri, 8:30am-5pm [facility] appointment scheduling ph- [phone])     [facility]  [address] [place] [address]    ph- [phone]  Mon-Fri, 8am-4:30pm     [facility]  [address], [address] [place] [address]   [phone]  Mon-Thurs, 8am-6pm, Fri 8am-5pm     [facility]  [address] [place] [address]    [phone], 8am-5pm     [facility]  [address] [place] [address]   [phone], 9:30am-6pm, Tue-Fri, 8:30am-5pm     [facility]  [address] [place] [address]   [phone], 8:30am-6:30pm, Tue-Fri, 8am-5:30pm     [facility]  [address], [place] [address]    [phone]- [place], 8:30am-5pm     [facility]  [address] [place], [place] [address]   [phone] and Fri, 7:30am-5:30pm, Tue-Thurs  7:30am-6pm     [facility]  [address] [place] [address]   [phone]  Call for appointment     [facility]  [address], [place] [address]   [phone], call for appointments           Allergy Info: sulfa drugs     Medication Information:  [facility] ED Physicians provided you with a complete list of medications post discharge, if you have been instructed to stop taking a medication please ensure you also follow up with this information to your Primary Care Physician.  Unless otherwise noted, patient will continue to take medications as prescribed prior to the Emergency Room visit.  Any specific questions regarding your chronic medications and dosages should be discussed with your physician(s) and pharmacist.          No Medications Documented      Medications Administered During Visit:              Medication Dose Route   ceftriaxone 250 mg IM   azithromycin 1000 mg Oral          Major Tests and Procedures:  The following procedures and tests were performed during your ED visit.  COMMON PROCEDURES%>  COMMON PROCEDURES COMMENTS%>          Laboratory Orders  Name Status Details   .Preg U POC Completed Urine, RT, RT - Routine, Collected, 01/12/19 17:55:00 EST, Nurse collect, 01/12/19 17:55:00 US [name], RAL POC Login   .UA POC Completed Urine, RT, RT - Routine, Collected, 01/12/19 17:55:00 EST, Nurse collect, 01/12/19 17:55:00 US [name], RAL POC Login   CT GC Genital Ordered Genital, Stat, ST - Stat, 01/12/19 17:49:00 EST, Once, 01/12/19 17:49:00 EST, Nurse collect   KOH Completed Vaginal, Stat, ST - Stat, 01/12/19 17:49:00 EST, 01/12/19 17:49:00 EST, Nurse collect   Wet Prep Completed Vaginal, Stat, ST - Stat, 01/12/19 17:49:00 EST, 01/12/19 17:49:00 EST, Nurse collect, RAMSEY-PAC,  MARIAH K, Print label Y/N               Radiology Orders  No radiology orders were placed.              Patient Care Orders  Name Status Details   Discharge  Patient Ordered 01/12/19  18:47:00 EST   ED Assessment Adult Completed 01/12/19 17:44:34 EST, 01/12/19 17:44:34 EST   ED Secondary Triage Completed 01/12/19 17:44:34 EST, 01/12/19 17:44:34 EST   ED Triage Adult Completed 01/12/19 17:12:54 EST, 01/12/19 17:12:54 EST   POC-Urine Dipstick collect Completed 01/12/19 17:49:00 EST, Once, 01/12/19 17:49:00 EST   POC-Urine Pregnancy Test collect Completed 01/12/19 17:49:00 EST, Once, 01/12/19 17:49:00 EST   Pelvic Exam Setup Completed 01/12/19 17:49:00 EST, Once, 01/12/19 17:49:00 EST       ---------------------------------------------------------------------------------------------------------------------  [facility]) encourages you to self-enroll in the [facility] Patient Portal.  [facility] Patient Portal will allow you to manage your personal health information securely from your own electronic device now and in the future.  To begin your Patient Portal enrollment process, please visit [email or web address] Click on "Sign up now" under [facility].  If you find that you need additional assistance on the [facility] Patient Portal or need a copy of your medical records, please call the [facility] Medical Records Office at [phone].  Comment:

## 2019-01-12 NOTE — ED Provider Notes (Signed)
STD exposure        Patient:   Doris Salazar, Doris Salazar             MRN: [id number]            FIN: [id number]               Age:   28 years     Sex:  Female     DOB:  [birth year]   Associated Diagnoses:   STD exposure   Author:   Fleet Contras K      Basic Information   Time seen: Provider Seen (ST)   ED Provider/Time:    RAMSEY-PAC,  MARIAH K / 01/12/2019 17:38  .   Additional information: Chief Complaint from Nursing Triage Note   Chief Complaint  Chief Complaint: pr states she had intercourse on Saturday and her partner had Trichomoniasis, and told her today to check for exposure.denies any urinary symptoms. (01/12/19 17:40:00).      History of Present Illness   28 year old female presented to the ER for STD exposure.  Patient states recent sexual partner tested positive for trichomoniasis.  Patient is currently asymptomatic.  She would like for testing and pelvic performed.  Denies fever, myalgias, chills, nausea, vomiting, abdominal pain, urinary symptoms, vaginal discharge, back pain..        Review of Systems   Constitutional symptoms:  No fever, no chills, no fatigue.    Skin symptoms:  No jaundice, no rash.    Eye symptoms:  No pain, no discharge.    ENMT symptoms:  No sore throat, no nasal congestion.    Respiratory symptoms:  No shortness of breath, no cough.    Cardiovascular symptoms:  No chest pain, no peripheral edema.    Gastrointestinal symptoms:  No abdominal pain, no nausea, no vomiting, no diarrhea.    Genitourinary symptoms:  No dysuria, no vaginal discharge.    Musculoskeletal symptoms:  No back pain,    Neurologic symptoms:  No headache, no dizziness.              Additional review of systems information: All systems reviewed as documented in chart.      Health Status   Allergies:    Allergic Reactions (Selected)  Mild  Sulfa drugs- Hives..      Past Medical/ Family/ Social History   Surgical history: Reviewed as documented in chart.   Family history: Reviewed as documented in chart.   Social  history: Reviewed as documented in chart.   Problem list:    Active Problems (1)  No Chronic Problems   , per nurse's notes.      Physical Examination               Vital Signs   Vital Signs   01/12/2019 17:45 EST Systolic Blood Pressure 122 mmHg    Diastolic Blood Pressure 82 mmHg    Temperature Oral 37 degC    Peripheral Pulse Rate 86 bpm    Heart Rate Monitored 86 bpm    Mean Arterial Pressure, Cuff 98 mmHg    SpO2 99 %   01/12/2019 17:40 EST Systolic Blood Pressure 122 mmHg    Diastolic Blood Pressure 82 mmHg    Temperature Oral 37 degC    Heart Rate Monitored 79 bpm    Respiratory Rate 18 br/min    SpO2 99 %   .   Measurements   01/12/2019 17:44 EST Body Mass Index est meas 35.31 kg/m2    Body  Mass Index Measured 35.31 kg/m2   01/12/2019 17:40 EST Height/Length Measured 152.4 cm    Weight Dosing 82 kg   .   General:  Alert, no acute distress, Not ill-appearing,    Skin:  Warm, dry.    Head:  Normocephalic.   Neck:  Supple.   Eye:  Normal conjunctiva, vision grossly normal.    Cardiovascular:  Regular rate and rhythm, No murmur.    Respiratory:  Lungs are clear to auscultation, respirations are non-labored.    Chest wall:  No deformity.   Back:  Normal range of motion.   Musculoskeletal:  Normal ROM, normal strength.    Gastrointestinal:  Soft, Nontender, Non distended.    Genitourinary:  No tenderness, no discharge, normal external genitalia, no lesions, Tech in room during exam.  No lateralizing pain, masses in adnexa or uterus.  No CMT..    Neurological:  Normal motor observed, normal speech observed.    Psychiatric:  Appropriate mood & affect.      Medical Decision Making   Results review:  Lab results : Lab View   01/12/2019 18:27 EST KOH Smear See Result    WpTrich Absent    WpClue Absent    WpEpithelial Present    WpWBC Absent    WpRBC Absent   01/12/2019 17:55 EST Preg U POC Negative    Appear U POC Clear    Color U POC Yellow    Bili U POC Negative    Blood U POC Negative    Glucose U POC Negative mg/dL     Ketones U POC Negative mg/dL    Leuk Est U POC Negative    Nitrite U POC Negative    pH U POC 7.0    Protein U POC Negative    Spec Grav U POC 1.020    Urobilin U POC 0.2 EU/dL   .   Notes:  28 year old female presenting to the ER for STD exposure.  Requesting pelvic and testing.  Exam performed as discussed above.  No signs of PID without CMT.  KOH and wet prep normal.  Gonorrhea chlamydia is pending.  Patient treated empirically with Rocephin and azithromycin in the ER.  Given STD precautions and follow-up with health clinic recommended.  Nontoxic amenable for discharge..      Impression and Plan   Diagnosis   STD exposure (ICD10-CM Z20.2, Discharge, Medical)   Plan   Condition: Improved, Stable.    Disposition: Discharged: Time  01/12/2019 18:46:00, to home.    Prescriptions: [facility] prescriptions   Pharmacy:  Diflucan 150 mg oral tablet (Prescribe): 150 mg, 1 tabs, Oral, Once, as needed for yeast infection, 1 tabs, 0 Refill(s).    Patient was given the following educational materials: [facility] Referral List (Custom).    Follow up with: Public Health Department Within 1 week Return to ED if symptoms worsen. Please refrain from sexual activity for 1 week after treatment.???? Please inform all partners of????infection. ????If partners aren't treated and sexual activity resumes you can be reinfected.???? Please wear protection in the future for all sexual encounters.  .    Counseled: I had a detailed discussion with the patient and/or guardian regarding the historical points/exam findings supporting the discharge diagnosis and need for outpatient followup. Discussed the need to return to the ER if symptoms persist/worsen, or for any questions/concerns that arise at home.    Sexually transmitted disease prophylaxis: Accepted.    Teacher, adult education on  01/12/2019 08:03 PM EST   ________________________________________________   Fleet Contras K      Electronically Signed on 01/20/2019  12:33 PM EST   ________________________________________________   Tommie Ard            Modified by: Fleet Contras K on 01/12/2019 06:47 PM EST      Modified by: Fleet Contras K on 01/12/2019 07:03 PM EST      Modified by: Fleet Contras K on 01/12/2019 08:03 PM EST

## 2019-01-17 LAB — C.TRACHOMATIS N.GONORRHOEAE DNA
Chlamydia trachomatis, NAA: NEGATIVE
Neisseria Gonorrhoeae, NAA: NEGATIVE

## 2019-09-27 LAB — COMPREHENSIVE METABOLIC PANEL
ALT: 16 U/L (ref 0–33)
AST: 17 U/L (ref 0–32)
Albumin/Globulin Ratio: 1.2 mmol/L (ref 1.00–2.70)
Albumin: 3.9 g/dL (ref 3.5–5.2)
Alk Phosphatase: 71 U/L (ref 35–117)
Anion Gap: 11 mmol/L (ref 2–17)
BUN: 11 mg/dL (ref 6–20)
CO2: 24 mmol/L (ref 22–29)
Calcium: 8.9 mg/dL (ref 8.6–10.0)
Chloride: 104 mmol/L (ref 98–107)
Creatinine: 0.6 mg/dL (ref 0.5–1.0)
GFR African American: 142 mL/min/{1.73_m2} (ref >=90)
GFR Non-African American: 122 mL/min/{1.73_m2} (ref >=90)
Globulin: 3 g/dL (ref 1.9–4.4)
Glucose: 120 mg/dL — ABNORMAL HIGH (ref 70–99)
OSMOLALITY CALCULATED: 278 mOsm/kg (ref 270–287)
Potassium: 3.7 mmol/L (ref 3.5–5.3)
Sodium: 139 mmol/L (ref 135–145)
Total Bilirubin: 0.2 mg/dL (ref 0.00–1.20)
Total Protein: 7.1 g/dL (ref 6.4–8.3)

## 2019-09-27 LAB — CBC WITH AUTO DIFFERENTIAL
Absolute Baso #: 0 10*3/uL (ref 0.0–0.2)
Absolute Eos #: 0.1 10*3/uL (ref 0.0–0.5)
Absolute Lymph #: 2.3 10*3/uL (ref 1.0–3.2)
Absolute Mono #: 0.4 10*3/uL (ref 0.3–1.0)
Basophils %: 0.7 % (ref 0.0–2.0)
Eosinophils %: 2.3 % (ref 0.0–7.0)
Hematocrit: 37.6 % (ref 34.0–47.0)
Hemoglobin: 12.2 g/dL (ref 11.5–15.7)
Immature Grans (Abs): 0.02 10*3/uL (ref 0.00–0.06)
Immature Granulocytes: 0.3 % (ref 0.1–0.6)
Lymphocytes: 40.7 % (ref 15.0–45.0)
MCH: 27.5 pg (ref 27.0–34.5)
MCHC: 32.4 g/dL (ref 32.0–36.0)
MCV: 84.9 fL (ref 81.0–99.0)
MPV: 11.3 fL (ref 7.2–13.2)
Monocytes: 6.5 % (ref 4.0–12.0)
NRBC Absolute: 0 10*3/uL (ref 0.000–0.012)
NRBC Automated: 0 % (ref 0.0–0.2)
Neutrophils %: 49.5 % (ref 42.0–74.0)
Neutrophils Absolute: 2.8 10*3/uL (ref 1.6–7.3)
Platelets: 229 10*3/uL (ref 140–440)
RBC: 4.43 x10e6/mcL (ref 3.60–5.20)
RDW: 13.2 % (ref 11.0–16.0)
WBC: 5.7 10*3/uL (ref 3.8–10.6)

## 2019-09-27 LAB — TSH: TSH, 3RD GENERATION: 1.44 mcIU/mL (ref 0.358–3.740)

## 2019-09-27 LAB — T4, FREE: T4 Free: 0.94 ng/dL (ref 0.82–1.70)

## 2019-09-27 LAB — C-REACTIVE PROTEIN: CRP: 1.9 mg/dL — ABNORMAL HIGH (ref 0.00–0.50)
